# Patient Record
Sex: Female | Born: 1940 | Race: White | Hispanic: No | Marital: Married | Smoking: Never smoker
Health system: Southern US, Community
[De-identification: ages and names within clinical notes are randomized; demographics above are authoritative.]

## PROBLEM LIST (undated history)

## (undated) DIAGNOSIS — L03119 Cellulitis of unspecified part of limb: Secondary | ICD-10-CM

## (undated) DIAGNOSIS — M25552 Pain in left hip: Secondary | ICD-10-CM

## (undated) DIAGNOSIS — E079 Disorder of thyroid, unspecified: Secondary | ICD-10-CM

## (undated) DIAGNOSIS — T7840XA Allergy, unspecified, initial encounter: Secondary | ICD-10-CM

## (undated) DIAGNOSIS — C50919 Malignant neoplasm of unspecified site of unspecified female breast: Secondary | ICD-10-CM

## (undated) HISTORY — PX: BACK SURGERY: SHX140

## (undated) HISTORY — DX: Cellulitis of unspecified part of limb: L03.119

## (undated) HISTORY — DX: Allergy, unspecified, initial encounter: T78.40XA

## (undated) HISTORY — DX: Pain in left hip: M25.552

## (undated) HISTORY — DX: Disorder of thyroid, unspecified: E07.9

## (undated) HISTORY — PX: COLONOSCOPY: SHX174

## (undated) HISTORY — PX: TONSILLECTOMY: SUR1361

## (undated) HISTORY — DX: Malignant neoplasm of unspecified site of unspecified female breast: C50.919

---

## 1999-06-23 ENCOUNTER — Other Ambulatory Visit: Admission: RE | Admit: 1999-06-23 | Discharge: 1999-06-23 | Payer: Self-pay | Admitting: Obstetrics and Gynecology

## 2000-07-13 ENCOUNTER — Other Ambulatory Visit: Admission: RE | Admit: 2000-07-13 | Discharge: 2000-07-13 | Payer: Self-pay | Admitting: Obstetrics and Gynecology

## 2000-11-14 DIAGNOSIS — C50919 Malignant neoplasm of unspecified site of unspecified female breast: Secondary | ICD-10-CM

## 2000-11-14 HISTORY — PX: MASTECTOMY PARTIAL / LUMPECTOMY W/ AXILLARY LYMPHADENECTOMY: SUR852

## 2000-11-14 HISTORY — DX: Malignant neoplasm of unspecified site of unspecified female breast: C50.919

## 2001-08-01 ENCOUNTER — Other Ambulatory Visit: Admission: RE | Admit: 2001-08-01 | Discharge: 2001-08-01 | Payer: Self-pay | Admitting: Obstetrics and Gynecology

## 2001-08-20 ENCOUNTER — Encounter: Payer: Self-pay | Admitting: General Surgery

## 2001-08-20 ENCOUNTER — Encounter: Admission: RE | Admit: 2001-08-20 | Discharge: 2001-08-20 | Payer: Self-pay | Admitting: *Deleted

## 2001-08-30 ENCOUNTER — Encounter: Admission: RE | Admit: 2001-08-30 | Discharge: 2001-08-30 | Payer: Self-pay | Admitting: General Surgery

## 2001-08-30 ENCOUNTER — Encounter (INDEPENDENT_AMBULATORY_CARE_PROVIDER_SITE_OTHER): Payer: Self-pay | Admitting: *Deleted

## 2001-08-30 ENCOUNTER — Ambulatory Visit (HOSPITAL_BASED_OUTPATIENT_CLINIC_OR_DEPARTMENT_OTHER): Admission: RE | Admit: 2001-08-30 | Discharge: 2001-08-30 | Payer: Self-pay | Admitting: General Surgery

## 2001-08-30 ENCOUNTER — Encounter: Payer: Self-pay | Admitting: General Surgery

## 2001-09-10 ENCOUNTER — Encounter (INDEPENDENT_AMBULATORY_CARE_PROVIDER_SITE_OTHER): Payer: Self-pay | Admitting: *Deleted

## 2001-09-10 ENCOUNTER — Encounter: Payer: Self-pay | Admitting: General Surgery

## 2001-09-10 ENCOUNTER — Ambulatory Visit (HOSPITAL_BASED_OUTPATIENT_CLINIC_OR_DEPARTMENT_OTHER): Admission: RE | Admit: 2001-09-10 | Discharge: 2001-09-10 | Payer: Self-pay | Admitting: General Surgery

## 2001-09-18 ENCOUNTER — Encounter (HOSPITAL_COMMUNITY): Admission: RE | Admit: 2001-09-18 | Discharge: 2001-10-18 | Payer: Self-pay | Admitting: Oncology

## 2001-09-18 ENCOUNTER — Ambulatory Visit: Admission: RE | Admit: 2001-09-18 | Discharge: 2001-12-17 | Payer: Self-pay | Admitting: Radiation Oncology

## 2001-09-25 ENCOUNTER — Encounter (HOSPITAL_COMMUNITY): Payer: Self-pay | Admitting: Oncology

## 2002-02-18 ENCOUNTER — Encounter: Admission: RE | Admit: 2002-02-18 | Discharge: 2002-02-18 | Payer: Self-pay | Admitting: Oncology

## 2002-02-18 ENCOUNTER — Encounter (HOSPITAL_COMMUNITY): Admission: RE | Admit: 2002-02-18 | Discharge: 2002-03-20 | Payer: Self-pay | Admitting: Oncology

## 2002-02-26 ENCOUNTER — Ambulatory Visit (HOSPITAL_COMMUNITY): Admission: RE | Admit: 2002-02-26 | Discharge: 2002-02-26 | Payer: Self-pay | Admitting: Internal Medicine

## 2002-03-14 ENCOUNTER — Encounter: Admission: RE | Admit: 2002-03-14 | Discharge: 2002-03-14 | Payer: Self-pay | Admitting: General Surgery

## 2002-03-14 ENCOUNTER — Encounter: Payer: Self-pay | Admitting: General Surgery

## 2002-09-09 ENCOUNTER — Encounter (HOSPITAL_COMMUNITY): Admission: RE | Admit: 2002-09-09 | Discharge: 2002-10-09 | Payer: Self-pay | Admitting: Oncology

## 2002-09-09 ENCOUNTER — Encounter: Admission: RE | Admit: 2002-09-09 | Discharge: 2002-09-09 | Payer: Self-pay | Admitting: Oncology

## 2002-10-02 ENCOUNTER — Other Ambulatory Visit: Admission: RE | Admit: 2002-10-02 | Discharge: 2002-10-02 | Payer: Self-pay | Admitting: Obstetrics and Gynecology

## 2003-03-11 ENCOUNTER — Encounter (HOSPITAL_COMMUNITY): Admission: RE | Admit: 2003-03-11 | Discharge: 2003-04-10 | Payer: Self-pay | Admitting: Oncology

## 2003-03-11 ENCOUNTER — Encounter: Admission: RE | Admit: 2003-03-11 | Discharge: 2003-03-11 | Payer: Self-pay | Admitting: Oncology

## 2003-04-15 ENCOUNTER — Encounter: Payer: Self-pay | Admitting: General Surgery

## 2003-04-15 ENCOUNTER — Encounter: Admission: RE | Admit: 2003-04-15 | Discharge: 2003-04-15 | Payer: Self-pay | Admitting: General Surgery

## 2003-04-21 ENCOUNTER — Encounter (HOSPITAL_COMMUNITY): Payer: Self-pay | Admitting: Oncology

## 2003-04-21 ENCOUNTER — Encounter (HOSPITAL_COMMUNITY): Admission: RE | Admit: 2003-04-21 | Discharge: 2003-07-20 | Payer: Self-pay | Admitting: Oncology

## 2003-04-22 ENCOUNTER — Encounter (HOSPITAL_COMMUNITY): Payer: Self-pay | Admitting: Oncology

## 2003-04-22 ENCOUNTER — Ambulatory Visit (HOSPITAL_COMMUNITY): Admission: RE | Admit: 2003-04-22 | Discharge: 2003-04-22 | Payer: Self-pay | Admitting: Oncology

## 2003-09-10 ENCOUNTER — Encounter (HOSPITAL_COMMUNITY): Admission: RE | Admit: 2003-09-10 | Discharge: 2003-10-10 | Payer: Self-pay | Admitting: Oncology

## 2003-09-10 ENCOUNTER — Encounter: Admission: RE | Admit: 2003-09-10 | Discharge: 2003-09-10 | Payer: Self-pay | Admitting: Oncology

## 2003-10-06 ENCOUNTER — Other Ambulatory Visit: Admission: RE | Admit: 2003-10-06 | Discharge: 2003-10-06 | Payer: Self-pay | Admitting: Obstetrics and Gynecology

## 2003-10-16 ENCOUNTER — Ambulatory Visit: Admission: RE | Admit: 2003-10-16 | Discharge: 2003-10-16 | Payer: Self-pay | Admitting: Radiation Oncology

## 2004-05-03 ENCOUNTER — Encounter: Admission: RE | Admit: 2004-05-03 | Discharge: 2004-05-03 | Payer: Self-pay | Admitting: Oncology

## 2004-06-28 ENCOUNTER — Encounter: Admission: RE | Admit: 2004-06-28 | Discharge: 2004-06-28 | Payer: Self-pay | Admitting: Oncology

## 2004-09-28 ENCOUNTER — Encounter: Admission: RE | Admit: 2004-09-28 | Discharge: 2004-09-28 | Payer: Self-pay | Admitting: Oncology

## 2004-09-28 ENCOUNTER — Ambulatory Visit (HOSPITAL_COMMUNITY): Payer: Self-pay | Admitting: Oncology

## 2004-09-28 ENCOUNTER — Encounter (HOSPITAL_COMMUNITY): Admission: RE | Admit: 2004-09-28 | Discharge: 2004-10-28 | Payer: Self-pay | Admitting: Oncology

## 2004-10-21 ENCOUNTER — Ambulatory Visit: Admission: RE | Admit: 2004-10-21 | Discharge: 2004-10-21 | Payer: Self-pay | Admitting: Radiation Oncology

## 2004-10-28 ENCOUNTER — Ambulatory Visit: Admission: RE | Admit: 2004-10-28 | Discharge: 2004-10-28 | Payer: Self-pay | Admitting: Radiation Oncology

## 2004-11-22 ENCOUNTER — Other Ambulatory Visit: Admission: RE | Admit: 2004-11-22 | Discharge: 2004-11-22 | Payer: Self-pay | Admitting: Obstetrics and Gynecology

## 2005-02-23 ENCOUNTER — Ambulatory Visit (HOSPITAL_COMMUNITY): Admission: RE | Admit: 2005-02-23 | Discharge: 2005-02-23 | Payer: Self-pay | Admitting: Internal Medicine

## 2005-09-01 ENCOUNTER — Ambulatory Visit (HOSPITAL_COMMUNITY): Admission: RE | Admit: 2005-09-01 | Discharge: 2005-09-01 | Payer: Self-pay | Admitting: Internal Medicine

## 2005-09-27 ENCOUNTER — Encounter: Admission: RE | Admit: 2005-09-27 | Discharge: 2005-09-27 | Payer: Self-pay | Admitting: Oncology

## 2005-09-27 ENCOUNTER — Encounter (HOSPITAL_COMMUNITY): Admission: RE | Admit: 2005-09-27 | Discharge: 2005-10-27 | Payer: Self-pay | Admitting: Oncology

## 2005-09-27 ENCOUNTER — Ambulatory Visit (HOSPITAL_COMMUNITY): Payer: Self-pay | Admitting: Oncology

## 2006-03-07 ENCOUNTER — Other Ambulatory Visit: Admission: RE | Admit: 2006-03-07 | Discharge: 2006-03-07 | Payer: Self-pay | Admitting: Obstetrics & Gynecology

## 2006-08-08 ENCOUNTER — Encounter (HOSPITAL_COMMUNITY): Admission: RE | Admit: 2006-08-08 | Discharge: 2006-08-11 | Payer: Self-pay | Admitting: Oncology

## 2006-08-08 ENCOUNTER — Encounter: Admission: RE | Admit: 2006-08-08 | Discharge: 2006-08-11 | Payer: Self-pay | Admitting: Oncology

## 2006-09-27 ENCOUNTER — Ambulatory Visit (HOSPITAL_COMMUNITY): Payer: Self-pay | Admitting: Oncology

## 2006-09-27 ENCOUNTER — Encounter: Admission: RE | Admit: 2006-09-27 | Discharge: 2006-09-27 | Payer: Self-pay | Admitting: Oncology

## 2007-09-26 ENCOUNTER — Ambulatory Visit (HOSPITAL_COMMUNITY): Payer: Self-pay | Admitting: Oncology

## 2008-09-24 ENCOUNTER — Encounter (HOSPITAL_COMMUNITY): Admission: RE | Admit: 2008-09-24 | Discharge: 2008-10-24 | Payer: Self-pay | Admitting: Oncology

## 2008-09-24 ENCOUNTER — Ambulatory Visit (HOSPITAL_COMMUNITY): Payer: Self-pay | Admitting: Oncology

## 2009-01-28 ENCOUNTER — Other Ambulatory Visit: Admission: RE | Admit: 2009-01-28 | Discharge: 2009-01-28 | Payer: Self-pay | Admitting: Obstetrics and Gynecology

## 2009-09-15 ENCOUNTER — Encounter (HOSPITAL_COMMUNITY): Admission: RE | Admit: 2009-09-15 | Discharge: 2009-10-15 | Payer: Self-pay | Admitting: Oncology

## 2009-10-12 ENCOUNTER — Ambulatory Visit (HOSPITAL_COMMUNITY): Payer: Self-pay | Admitting: Oncology

## 2010-10-11 ENCOUNTER — Encounter (HOSPITAL_COMMUNITY)
Admission: RE | Admit: 2010-10-11 | Discharge: 2010-11-10 | Payer: Self-pay | Source: Home / Self Care | Attending: Oncology | Admitting: Oncology

## 2010-10-11 ENCOUNTER — Ambulatory Visit (HOSPITAL_COMMUNITY): Payer: Self-pay | Admitting: Oncology

## 2010-10-12 ENCOUNTER — Encounter: Payer: Self-pay | Admitting: Orthopedic Surgery

## 2010-10-21 ENCOUNTER — Ambulatory Visit: Payer: Self-pay | Admitting: Orthopedic Surgery

## 2010-10-21 DIAGNOSIS — M25559 Pain in unspecified hip: Secondary | ICD-10-CM | POA: Insufficient documentation

## 2010-10-21 DIAGNOSIS — M1610 Unilateral primary osteoarthritis, unspecified hip: Secondary | ICD-10-CM | POA: Insufficient documentation

## 2010-10-21 DIAGNOSIS — M76899 Other specified enthesopathies of unspecified lower limb, excluding foot: Secondary | ICD-10-CM

## 2010-10-28 ENCOUNTER — Encounter: Payer: Self-pay | Admitting: Orthopedic Surgery

## 2010-12-02 ENCOUNTER — Encounter: Payer: Self-pay | Admitting: Orthopedic Surgery

## 2010-12-16 NOTE — Letter (Signed)
Summary: Report [facility]  Report [facility]   Imported By: Cammie Sickle 11/16/2010 18:04:10  _____________________________________________________________________  External Attachment:    Type:   Image     Comment:   External Document

## 2010-12-16 NOTE — Letter (Signed)
Summary: Request for strength training program  Request for strength training program   Imported By: Jacklynn Ganong 12/10/2010 12:51:36  _____________________________________________________________________  External Attachment:    Type:   Image     Comment:   External Document

## 2010-12-16 NOTE — Assessment & Plan Note (Signed)
Summary: LEFT HIP PAIN XR & BONE SCAN AT [facility]   Vital Signs:  Patient profile:   70 year old female Height:      65 inches Weight:      204 pounds Pulse rate:   76 / minute Resp:     18 per minute  Vitals Entered By: Fuller Canada MD (October 21, 2010 9:52 AM)  Visit Type:  new patient Referring Provider:  self Primary Provider:  Dr. [name]  CC:  left hip pain.  History of Present Illness: I saw Traci Mora in the office today for an initial visit.  She is a 70 years old woman with the complaint of:  left hip pain.  This is a 70 year old female, status post lumbar disc excision at L4-L5 for RIGHT sciatica, presents with LEFT hip pain in the groin and also in the front of the thigh, as well as on the iliac crest and along the LEFT greater trochanter,.  She's had a history of breast cancer, and, therefore, had a bone scan, which showed increased uptake over the LEFT greater trochanter. Otherwise, normal. She had an x-ray of her hips, which show some mild degenerative changes.  She has what she describes a sharp pain, which is rated 5/10, which tends to come and go it is worse when she climbs stairs. Over the Thanksgiving holiday. She had increased pain, but she recounts that she had Company over and was much more active. She does take 2 ibuprofen, but only has an as-needed basis and seems to get fairly good relief from that.  No injury.  Xrays and Bone scan of the left hip from 10/12/10   Meds: Levothyroxine, Zyrtec, Calcium, Presorvision, Lutein for eyes, One a day Vitamin.      Allergies (verified): No Known Drug Allergies  Past History:  Past Medical History: thyroid seasonal allergies macular degeneration  Past Surgical History: lumpectomy right breast tonsils back surgery L4-5 disk  Family History: FH of Cancer:   Social History: Patient is married.  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 Respiratory:  Complains of short of breath and couch; denies wheezing, tightness, pain on inspiration, and snoring . Neurologic:  Complains of tingling; denies numbness, unsteady gait, dizziness, tremors, and seizure; in toes. Musculoskeletal:  Complains of joint pain and stiffness; denies swelling, instability, redness, heat, and muscle pain. Endocrine:  Complains of heat or cold intolerance; denies excessive thirst and exessive urination. HEENT:  Complains of watering; denies blurred or double vision, eye pain, and redness; macular degeneration. Immunology:  Complains of seasonal allergies; denies sinus problems and allergic to bee stings.  The review of systems is negative for Cardiovascular, Gastrointestinal, Genitourinary, Psychiatric, Skin, and Hemoatologic.  Physical Exam  Additional Exam:  GEN: well developed, well nourished, normal grooming and hygiene, no deformity and endomorphicl body habitus.   CDV: pulses are normal, no edema, no erythema. no tenderness  Lymph: normal lymph nodes   Skin: no rashes, skin lesions or open sores   NEURO: normal coordination, reflexes, sensation.   Psyche: awake, alert and oriented. Mood normal   Gait: normal  Bilateral lower extremity examination. Inspection there was mild tenderness over both greater trochanters. No tenderness in the thigh or back. She had normal range of motion is not increased range of motion of both hips. She had no pain with flexion  internal rotation, abduction, abduction or internal or external rotation. Motor exam was normal. Both hips were stable and RIGHT straight leg raise was also negative, Lasegue's sign was negative.   Impression & Recommendations:  Problem # 1:  HIP PAIN (ICD-719.45) Assessment New The x-rays were done at [facility]. The report and the films have been reviewed. the bone scan,  and was as stated.  The radiographs of the hips, show minimal arthritic changes with a spherical femoral head, normal acetabulum, but no bone spurs, and normal femoral offset.  Impression mild arthritis, mild bursitis greater trochanter.  Plan continue activities as tolerated exercises as tolerated and follow up as needed  Other Orders: Est. Patient Level IV (04540)  Patient Instructions: 1)  CONTINUE EXERCISES  2)  NO THREAT OF HIP REPLACEMENT  3)  Please schedule a follow-up appointment as needed.   Orders Added: 1)  Est. Patient Level IV [98119]

## 2010-12-16 NOTE — Letter (Signed)
Summary: History form  History form   Imported By: Jacklynn Ganong 10/22/2010 09:31:18  _____________________________________________________________________  External Attachment:    Type:   Image     Comment:   External Document

## 2011-01-05 ENCOUNTER — Other Ambulatory Visit: Payer: Self-pay | Admitting: Dermatology

## 2011-03-29 IMAGING — CR DG HIP (WITH OR WITHOUT PELVIS) 2-3V*L*
3 series · 3 of 3 positions shown · non-contrast
Comparison: None

CLINICAL DATA: Left hip pain.

LEFT HIP - COMPLETE 2+ VIEW

[view not recorded (1 of 3)]
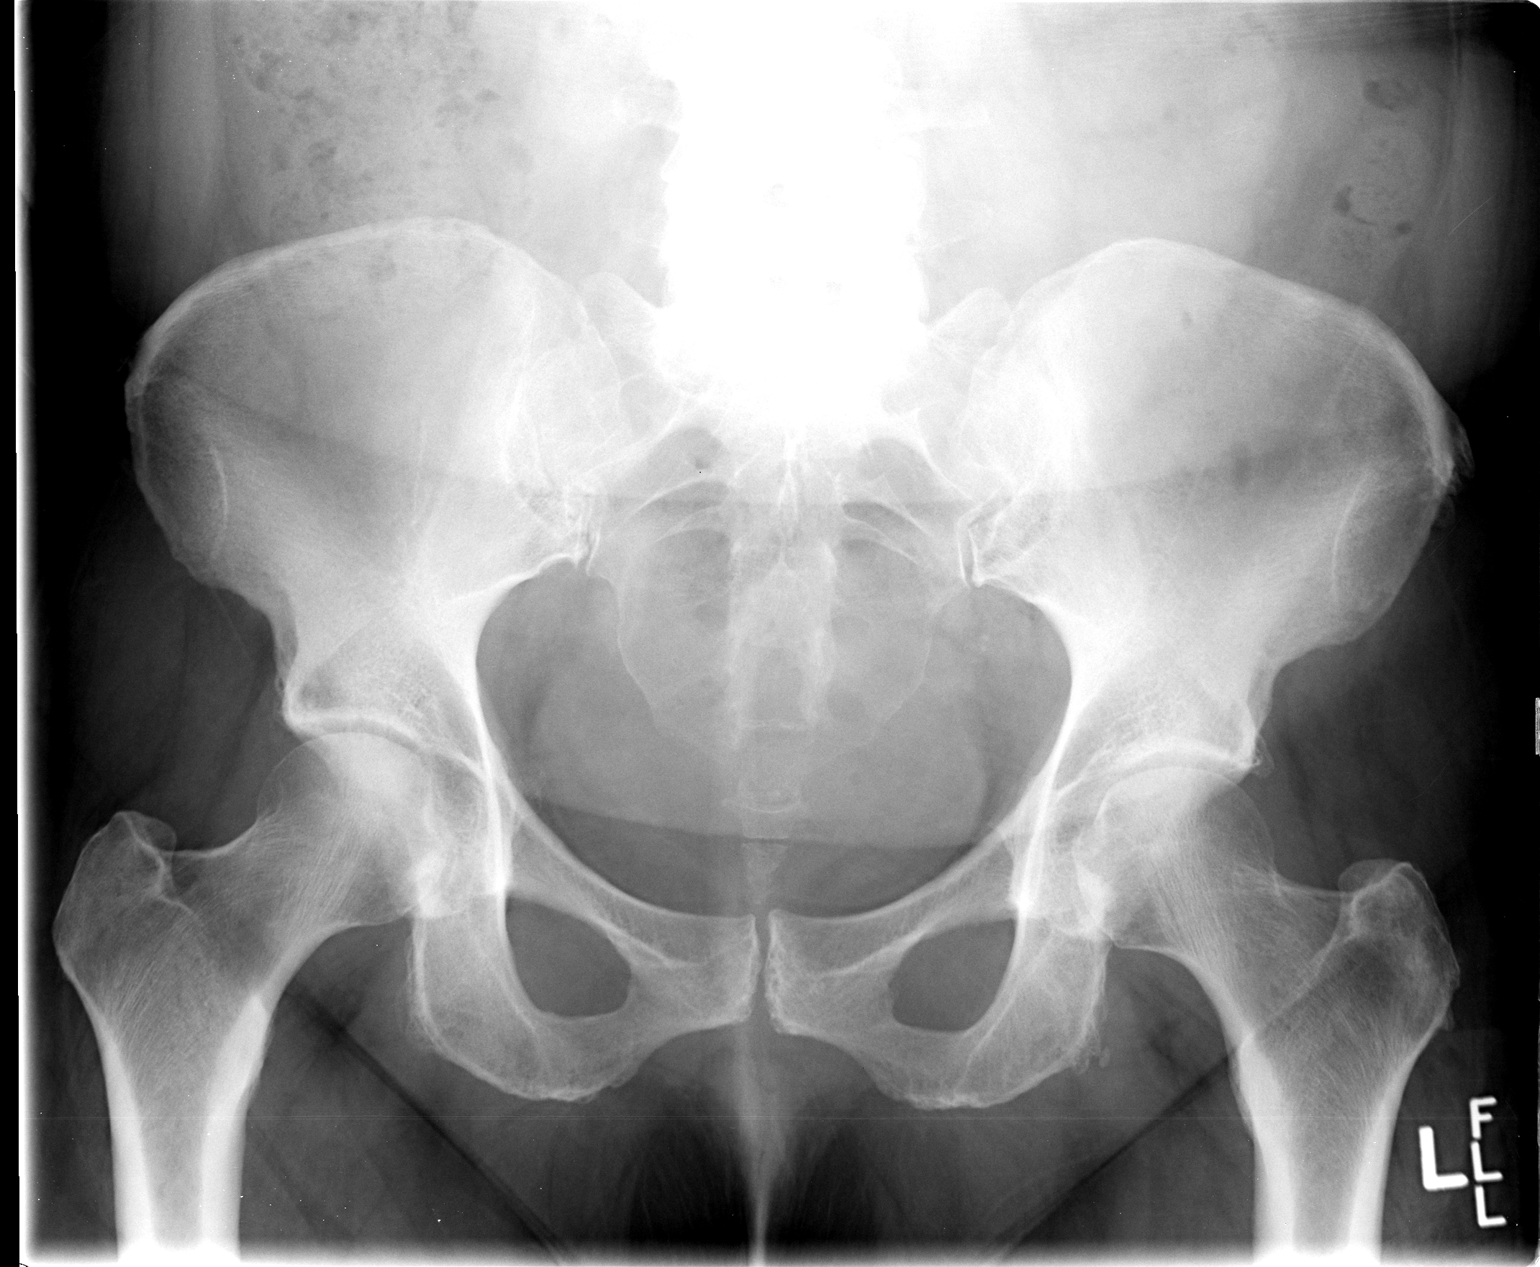

[view not recorded (2 of 3)]
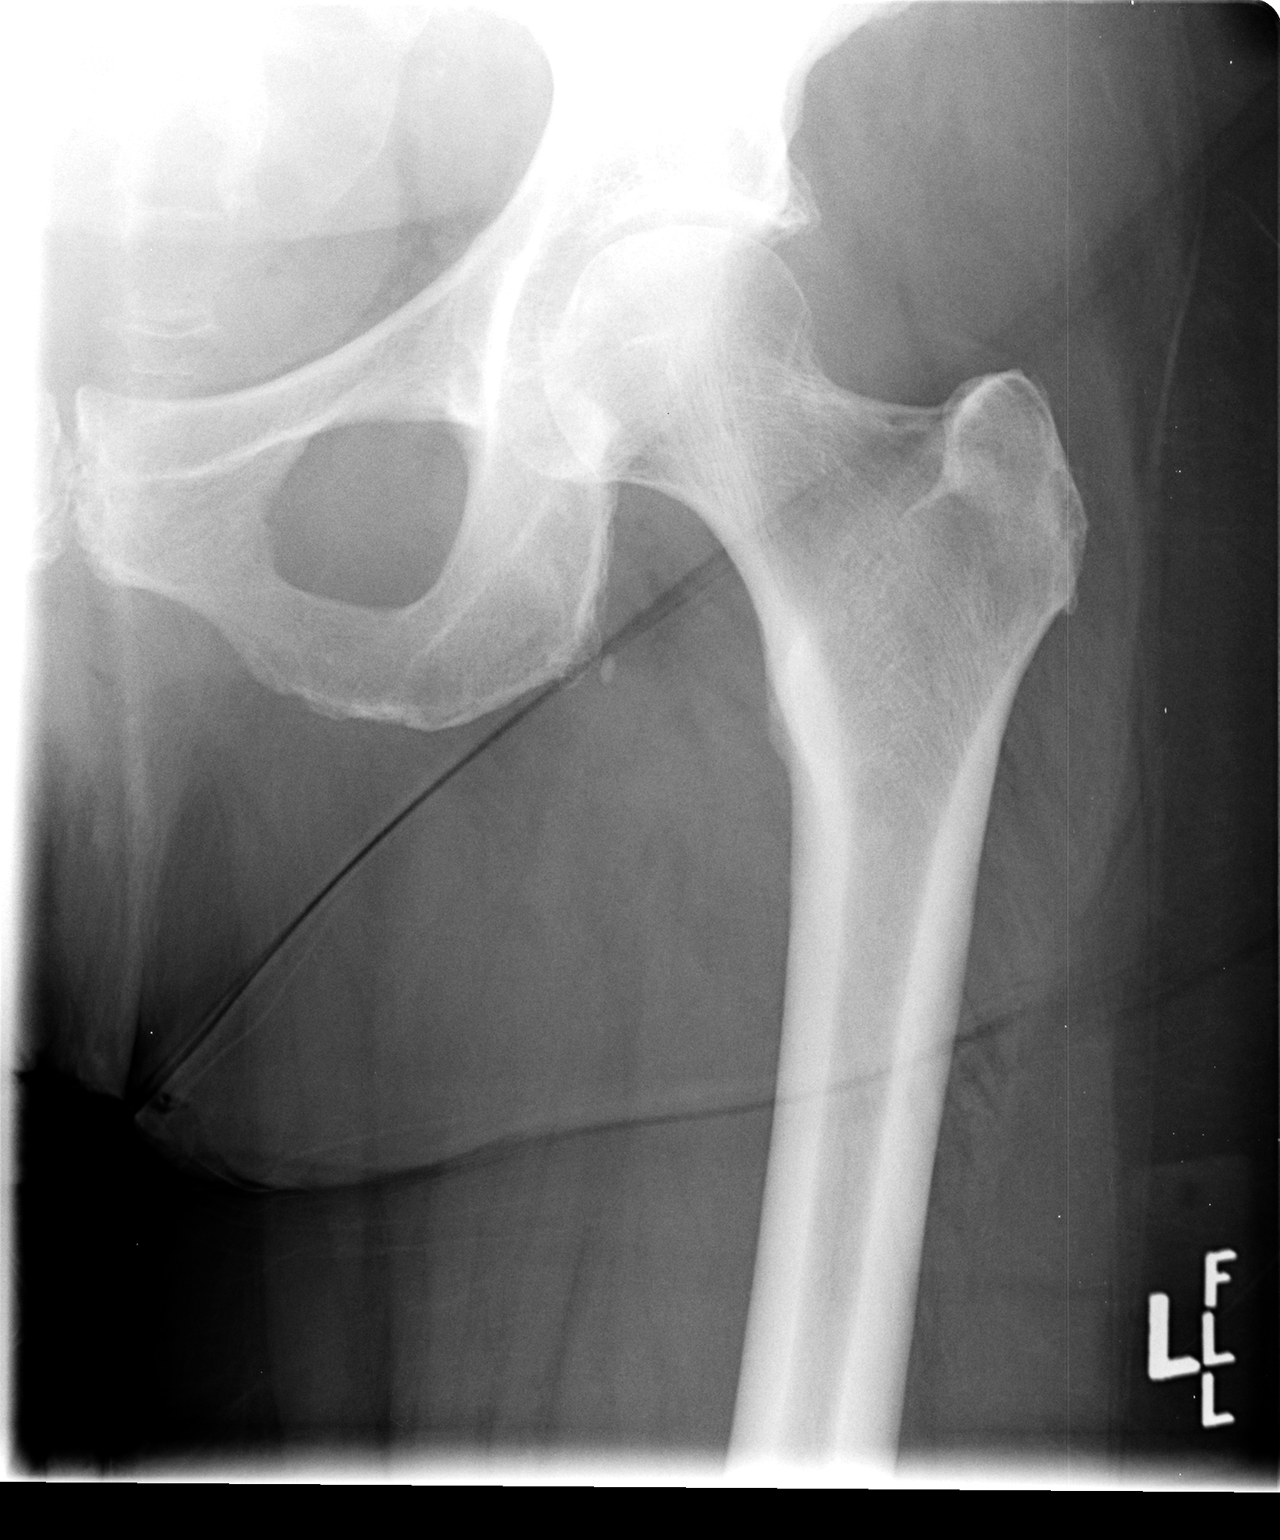

[view not recorded (3 of 3)]
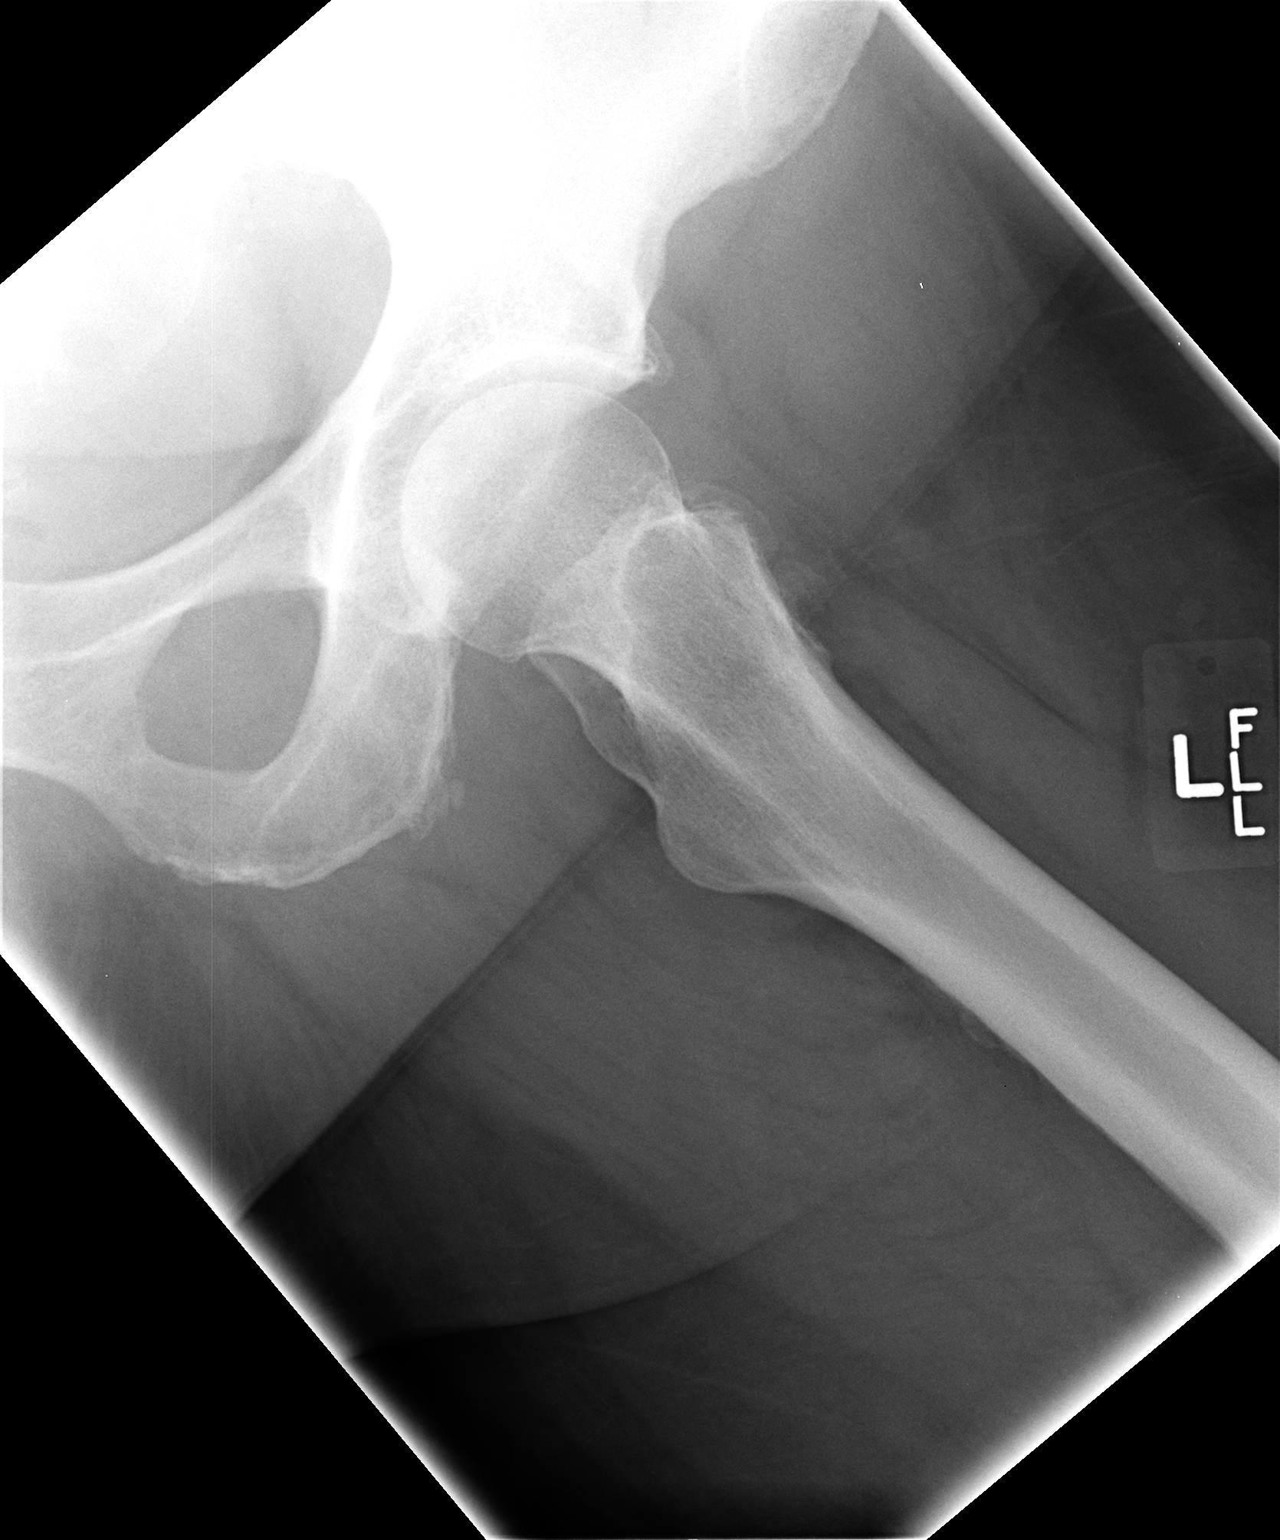

[3 of 3 positions shown; findings below may reference images not displayed]

FINDINGS: Mild degenerative changes in the hips, left slightly
greater than right.  I see no focal abnormality within the
visualized bony structures.  In particular, no abnormal area within
the left hip region as seen on prior bone scan.  No fracture.
IMPRESSION: Mild degenerative changes in the hips.  No acute bony abnormality.

## 2011-04-01 NOTE — Procedures (Signed)
Traci Mora, Traci Mora                ACCOUNT NO.:  [id number]   MEDICAL RECORD NO.:  [id number]          PATIENT TYPE:  OUT   LOCATION:  RAD                           FACILITY:  [facility]   PHYSICIAN:  Edward L. Juanetta Gosling, M.D.DATE OF BIRTH:  May 26, 1941   DATE OF PROCEDURE:  02/23/2005  DATE OF DISCHARGE:                              PULMONARY FUNCTION TEST   Spirometry shows no ventilatory defect but does show slight increase in flow  between 25 and 75% of the forced vital capacity. This could indicate small  airway disease and clinical correlation is suggested. Otherwise normal.      ELH/MEDQ  D:  02/23/2005  T:  02/24/2005  Job:  [id number]

## 2011-04-01 NOTE — Op Note (Signed)
[facility]  Patient:    Traci Mora, Traci Mora Visit Number: [id number] MRN: [id number]          Service Type: END Location: DAY Attending Physician:  Malissa Hippo Dictated by:   Lionel December, M.D. Proc. Date: 02/26/02 Admit Date:  02/26/2002   CC:         Altamese Dilling, M.D.  Ladona Horns. Mariel Sleet, M.D.   Operative Report  PROCEDURE:  Total colonoscopy.  ENDOSCOPIST:  Lionel December, M.D.  INDICATIONS:  Traci Mora is a 70 year old Caucasian who is undergoing screening colonoscopy.  Family history is negative for colorectal carcinoma. Personal history is significant for carcinoma of the breast.  She has no GI symptoms.  Procedure is reviewed with the patient, and informed consent is obtained.  PREOPERATIVE MEDICATIONS:  Demerol 50 mg IV, Versed 4 mg IV.  INSTRUMENTS:  Olympus video system.  DESCRIPTION OF PROCEDURE:  The procedure was performed in the endoscopy suite. The patients vital signs and O2 saturations were monitored during the procedure and remained stable.  The patient was placed in the left lateral decubitus position.  Rectal examination was performed.  No abnormality was noted on external digital exam.  The scope was placed in the rectum and advanced under direct vision into the sigmoid colon and beyond.  The preparation was excellent.  She had some liquid stool in her proximal colon. Scope was passed into the cecum which was identified by ileocecal valve and appendiceal orifice.  There was a small polyp on the fold of the ileocecal valve which was ablated by cold biopsy.  All of a sudden there was blurring. The lens was washed but picture was not clear.  Therefore, the scope was pulled out.  The lens was cleaned and passed again into the proximal colon but once again the hemorrhages were very blurred.  The scope was therefore removed yet again and different scope was used.  It was passed all the way to the cecum.  The cecal  landmarks were well identified.  As the scope was withdrawn, the mucosa was very carefully examined.  There was another 3-4 mm polyp at the sigmoid colon which was ablated by cold biopsy.  Mucosa of the rest of the colon was normal.  Rectal mucosa was also normal.  The scope was retroflexed and examined in the anorectal junction.  Small hemorrhoids were noted below the dentate line.  The endoscope was straightened and withdrawn.  The patient tolerated the procedure well.  FINAL DIAGNOSES: 1. Examination performed to the cecum. 2. Two tiny polyps were ablated with cold biopsy, one from the cecum,    and another one from the sigmoid colon. 3. Small external hemorrhoids.  RECOMMENDATIONS: 1. Standard instructions given. 2. I will be contacting the patient with biopsy results later this week. 3. Timing of her next colonoscopy will depend on histology on these polyps. Dictated by:   Lionel December, M.D. Attending Physician:  Malissa Hippo DD:  02/26/02 TD:  02/26/02 Job: [id number]

## 2011-04-01 NOTE — Op Note (Signed)
[place]  Patient:    Traci Mora, BLACKSON Visit Number: [id number] MRN: [id number]          Service Type: DSU Location: [facility] Attending Physician:  Brandy Hale Dictated by:   Angelia Mould. Derrell Lolling, M.D. Proc. Date: 08/30/01 Admit Date:  08/30/2001   CC:         Cynthia P. Ashley Royalty, M.D.                           Operative Report  PREOPERATIVE DIAGNOSIS:  Right breast mass.  POSTOPERATIVE DIAGNOSIS:  Right breast mass.  OPERATION PERFORMED:  Excisional biopsy of right breast mass, with needle localization and specimen mammogram.  SURGEON:  Angelia Mould. Derrell Lolling, M.D.  OPERATIVE INDICATIONS:  This is a 70 year old white female who had mammograms recently, which showed a focal area of architectural distortion in the upper outer quadrant of the right breast; measuring about 1 cm in size.  This was felt to be highly suspicious for malignancy.  On examination, she does seem to have a little bit of thickening in the upper outer quadrant of the right breast.  A core needle biopsy was performed by Dr. ______ on August 20, 2001 and the specimen was nondiagnostic.  There were atypical glands, somewhat suspicious for carcinoma but not diagnostic.  Excisional biopsy was recommended.  She developed a fairly significant hematoma in the lateral aspect of the right breast.  Nevertheless, she was able to undergo needle localization today and was brought to the operating room electively for wide local excision of this suspicious area.  OPERATIVE TECHNIQUE:  The patient underwent needle localization at the [facility], and was then transported to [facility] [employer].  She was brought to the operating room, where she was monitored and sedated by the anesthesia department.  The right breast was prepped and draped in a sterile fashion.  Xylocaine 1% with epinephrine was used as a local infiltration anesthetic.  A  curved incision was made in the upper outer quadrant of the right breast, overlying the suspected abnormality. The localizing wire entered quite medially in the breast.  There was a fairly significant hematoma at about the 9 oclock position of the breast, but this suspected abnormality was at about the 10 oclock position.  The incision in general paralleled the areolar margin.  Dissection was carried down into the breast tissue.  Medially we took the dissection down, identified the wire, and brought it into the wound.  We dissected around the localizing wire quite widely, and then removed the specimen.  We marked the superior, medial and inferior margins with sutures to orient the pathologist.  This specimen was sent for specimen mammogram, and the report by the radiologist was that the abnormal area had been completely removed.  Hemostasis was excellent and achieved with electrocautery.  The wound was irrigated with saline.  The skin was closed with a running subcuticular suture of 4-0 Vicryl and Steri-Strips. Clean bandages were placed.  The patient was taken to the recovery room in stable condition.  ESTIMATED BLOOD LOSS:  Approximately 20 cc.  COMPLICATIONS:  None.  COUNTS:  Sponge, needle and instrument counts were correct. Dictated by:   Angelia Mould. Derrell Lolling, M.D. Attending Physician:  Brandy Hale DD:  08/30/01 TD:  08/31/01 Job: [id number]

## 2011-04-01 NOTE — Op Note (Signed)
[place]  Patient:    Traci Mora, Traci Mora Visit Number: [id number] MRN: [id number]          Service Type: DSU Location: [facility] Attending Physician:  Brandy Hale Dictated by:   Angelia Mould. Derrell Lolling, M.D. Proc. Date: 09/10/01 Admit Date:  09/10/2001   CC:         Cynthia P. Ashley Royalty, M.D.   Operative Report  PREOPERATIVE DIAGNOSIS:  Carcinoma of the right breast.  POSTOPERATIVE DIAGNOSIS:  Carcinoma of the right breast.  OPERATION PERFORMED:  Right axillary sentinel lymph node mapping and biopsy, injection of blue dye.  SURGEON:  Angelia Mould. Derrell Lolling, M.D.  ANESTHESIA:  INDICATIONS FOR PROCEDURE:  The patient is a 70 year old white female who was recently found to have abnormal mammogram and a vague area of focal thickening in the upper outer quadrant of the right breast.  Core needle biopsy was nondiagnostic.  A right partial mastectomy was performed on August 30, 2001 and the final pathology report showed an invasive lobular carcinoma, 1.5 cm in diameter.  This was well-excised clinically and radiographically.  The pathology report showed that we had a clear margin, but the posterior margin was only 0.8 mm.  There was no evidence of lymphatic or vascular invasion and hormone receptors are pending.  I have discussed her care with Dr. Margaretmary Dys of Radiation Oncology department.  We have decided to offer conservation surgery to her which is her desire.  She is brought to the operating room for sentinel lymph node mapping and biopsy.  DESCRIPTION OF PROCEDURE:  The patient was injected in the nuclear medicine department this morning and brought to the [facility]. The patient was placed on the operating table and general endotracheal anesthesia was induced.  The right axilla and right breast and right chest wall were prepped and draped in sterile fashion.  Neoprobe was used and I could identify the area of  increased activity in the midaxilla which was separate from the injection site around the lumpectomy site.  The breast tissues were injected in four different areas with blue dye and the breast was massaged for three minutes.  I made a transverse incision in the right axilla at the level of the hairline where the area of increased radioactivity was. Dissection was carried down through the subcutaneous tissues.  Dissection was carried out for a short while.  We found sentinel lymph node deep in the level one area.  This lymph node was about 1 cm long by about 3 to 4 mm wide, was intensely blue and radioactivity in this lymph node was about 25 times that of the background activity.  Imprint cytology was performed by Dr. Francoise Schaumann and he said that there was no cancer cells seen.  A couple of other lymph nodes were removed as part of the dissection but they had essentially no blue dye and no radioactivity.  These were sent to the lab for routine histology.  Hemostasis was excellent and achieved with small metal clips and electrocautery.  The wound was irrigated with saline.  The wound was perfectly dry.  The skin was closed with running subcuticular suture of 4-0 Vicryl and Steri-Strips.  Clean bandages were placed and the patient taken to recovery room in stable condition.  Estimated blood loss was about 20 cc.  Complications were none. Sponge, needle and instrument counts were correct. Dictated by:   Angelia Mould. Derrell Lolling, M.D. Attending Physician:  Brandy Hale DD:  09/10/01  TD:  09/10/01 Job: [id number]

## 2011-08-16 LAB — OCCULT BLOOD (STOOL CUP TO LAB): Fecal Occult Bld: NEGATIVE

## 2011-08-16 LAB — OCCULT BLOOD X 1 CARD TO LAB, STOOL
Fecal Occult Bld: NEGATIVE
Fecal Occult Bld: POSITIVE

## 2011-10-10 ENCOUNTER — Encounter (HOSPITAL_COMMUNITY): Payer: Medicare Other | Attending: Oncology | Admitting: Oncology

## 2011-10-10 ENCOUNTER — Encounter (HOSPITAL_COMMUNITY): Payer: Self-pay | Admitting: Oncology

## 2011-10-10 VITALS — BP 140/78 | HR 71 | Temp 98.4°F | Ht 64.0 in | Wt 210.9 lb

## 2011-10-10 DIAGNOSIS — E669 Obesity, unspecified: Secondary | ICD-10-CM

## 2011-10-10 DIAGNOSIS — C50919 Malignant neoplasm of unspecified site of unspecified female breast: Secondary | ICD-10-CM | POA: Insufficient documentation

## 2011-10-10 DIAGNOSIS — E039 Hypothyroidism, unspecified: Secondary | ICD-10-CM | POA: Insufficient documentation

## 2011-10-10 DIAGNOSIS — Z09 Encounter for follow-up examination after completed treatment for conditions other than malignant neoplasm: Secondary | ICD-10-CM | POA: Insufficient documentation

## 2011-10-10 DIAGNOSIS — J309 Allergic rhinitis, unspecified: Secondary | ICD-10-CM | POA: Insufficient documentation

## 2011-10-10 DIAGNOSIS — F329 Major depressive disorder, single episode, unspecified: Secondary | ICD-10-CM

## 2011-10-10 DIAGNOSIS — Z853 Personal history of malignant neoplasm of breast: Secondary | ICD-10-CM | POA: Insufficient documentation

## 2011-10-10 LAB — CBC
Hemoglobin: 12.4 g/dL (ref 12.0–15.0)
MCHC: 33 g/dL (ref 30.0–36.0)
RBC: 4.29 MIL/uL (ref 3.87–5.11)
WBC: 9.2 10*3/uL (ref 4.0–10.5)

## 2011-10-10 LAB — DIFFERENTIAL
Basophils Relative: 0 % (ref 0–1)
Lymphocytes Relative: 15 % (ref 12–46)
Monocytes Relative: 8 % (ref 3–12)
Neutro Abs: 6.7 10*3/uL (ref 1.7–7.7)
Neutrophils Relative %: 73 % (ref 43–77)

## 2011-10-10 LAB — COMPREHENSIVE METABOLIC PANEL
AST: 24 U/L (ref 0–37)
Albumin: 3.6 g/dL (ref 3.5–5.2)
Alkaline Phosphatase: 85 U/L (ref 39–117)
BUN: 17 mg/dL (ref 6–23)
Chloride: 104 mEq/L (ref 96–112)
Potassium: 3.9 mEq/L (ref 3.5–5.1)
Total Bilirubin: 0.3 mg/dL (ref 0.3–1.2)

## 2011-10-10 NOTE — Progress Notes (Signed)
CC:   [name]. [name], MD Angelia Mould. Derrell Lolling, M.D. [facility] Dr. Margaretmary Dys Lionel December, M.D.  DIAGNOSES: 1. 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  Ki-67 marker low at 2%.  Two     sentinel nodes were negative and she was treated with lumpectomy     and sentinel node biopsy followed by radiation therapy and then she     declined any further therapy after a long discussion.  She has no     evidence of recurrent disease with her date of surgery on     09/10/2001. 2. Obesity, now weighing 210 pounds. 3. Hypothyroidism, on replacement. 4. Macular degeneration. 5. Migraine headaches. 6. Allergic rhinitis. 7. Possible asthmatic bronchitis. 8. Gastroesophageal reflux disease. 9. Hormone replacement therapy usage in the past. 10.Disk surgery in 1987. 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 12.Guaiac-positive stool in the past with a negative colonoscopy by     Dr. Karilyn Cota, I believe in 2011. 13.History of ulcer disease in the 1970s. 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 15.Left hip discomfort. Traci Mora states that she was up visiting some friends in [place], was exposed to smoke from a wood stove and some other allergens such as cats and dog dander, and she came home and both she and her husband were wheezing at times and coughing a lot.  She is still coughing but she states it does not keep her awake at night.  I just called Dr. [name] to touch base with him about this wheezing which is present on both lower lungs with each and every expiration, but he states that her workup was never shown asthma or reversible airway disease.  Other than that, she is getting overweight more and more.  She is not exercising.  She has sort of given up a lot of things in some ways  that she used to do on a very regular basis.  She is not always watching her portions, is still eating desserts.  In 12 weeks with [employer] she only lost 5 pounds, which was less than she had expected.  She has a lot of stress on her hands.  Her 2 boys are both having difficulties with their jobs and possible marriages, and this is really getting her down.  The macular degeneration and loss of vision is also getting her down, so she needs to start refocusing on her own health, I think, and get back to doing the things she needs to do such as watching what she eats, exercising, etc.  PHYSICAL EXAMINATION:  Se has on exam this weight of 210 pounds, height 5 feet 4 inches, BMI 36, and a very prominent abdomen just on seeing her sitting in a chair.  Blood pressure 140/78 left arm sitting position, pulse 72 and regular, respirations 18 to 20 and unlabored at rest.  She is afebrile.  Denies any pain.  The lower half of both lungs have expiratory wheezes which are quite significant.  She has no rales, no rubs.  She has no adenopathy in any location.  Both breasts are negative for masses.  The right shows the prior changes from surgery and radiation, which are minimal.  Heart:  Shows a regular rhythm and rate without ,murmur rub or gallop.  Abdomen:  Soft and nontender,  very obese.  Bowel sounds are normal.  She has no peripheral edema.  So we will see her in another year, but I did touch base with Dr. [name], who thinks that this is typically brought on, namely her wheezing, with allergy or infection.  I think her depression may or may not need to be treated at some point, but right now she is going to try to work on that on her own.  We will see her back in a year, sooner if need be.    ______________________________ Ladona Horns. Mariel Sleet, MD ESN/MEDQ  D:  10/10/2011  T:  10/10/2011  Job:  [id number]

## 2011-10-10 NOTE — Patient Instructions (Signed)
Traci Mora  [id number] [birth year]   [facility]  Discharge Instructions  RECOMMENDATIONS MADE BY THE CONSULTANT AND ANY TEST RESULTS WILL BE SENT TO YOUR REFERRING DOCTOR.   EXAM FINDINGS BY MD TODAY AND SIGNS AND SYMPTOMS TO REPORT TO CLINIC OR PRIMARY MD: Exam findings are normal except the cough and wheezing.  Please follow-up with Dr. [name] regarding your continued cough; wheezes were auscultated in your lung fields as well.  I acknowledge that I have been informed and understand all the instructions given to me and received a copy. I do not have any more questions at this time, but understand that I may call the Specialty Clinic at [facility] at [phone] during business hours should I have any further questions or need assistance in obtaining follow-up care.    __________________________________________  _____________  __________ Signature of Patient or Authorized Representative            Date                   Time    __________________________________________ Nurse's Signature

## 2011-10-10 NOTE — Progress Notes (Signed)
This office note has been dictated.

## 2012-06-14 ENCOUNTER — Ambulatory Visit: Payer: Medicare Other | Admitting: Orthopedic Surgery

## 2012-06-18 ENCOUNTER — Ambulatory Visit (HOSPITAL_COMMUNITY): Payer: Medicare Other | Admitting: Physical Therapy

## 2012-06-25 ENCOUNTER — Ambulatory Visit (HOSPITAL_COMMUNITY)
Admission: RE | Admit: 2012-06-25 | Discharge: 2012-06-25 | Disposition: A | Discharge status: Home / Self Care | Payer: Medicare Other | Source: Ambulatory Visit | Attending: Internal Medicine | Admitting: Internal Medicine

## 2012-06-25 DIAGNOSIS — M6281 Muscle weakness (generalized): Secondary | ICD-10-CM | POA: Insufficient documentation

## 2012-06-25 DIAGNOSIS — M542 Cervicalgia: Secondary | ICD-10-CM | POA: Insufficient documentation

## 2012-06-25 DIAGNOSIS — R293 Abnormal posture: Secondary | ICD-10-CM | POA: Insufficient documentation

## 2012-06-25 DIAGNOSIS — IMO0001 Reserved for inherently not codable concepts without codable children: Secondary | ICD-10-CM | POA: Insufficient documentation

## 2012-06-25 NOTE — Evaluation (Signed)
Physical Therapy Evaluation  Patient Details  Name: Traci Mora MRN: [id number] Date of Birth: Aug 16, 1941  Today's Date: 06/25/2012 Time: 0935-1015 PT Time Calculation (min): 40 min  Visit#: 1  of 8   Re-eval: 08/06/12 Assessment Diagnosis: cervical pain Prior Therapy: none   Authorization: medicare  Authorization Time Period:    Authorization Visit#: 1  of 10    Past Medical History:  Past Medical History  Diagnosis Date  . Gastric ulcer     history of  . Thyroid disease   . Breast cancer   . Cellulitis of leg     history of  . Left hip pain     history  . Allergy    Past Surgical History:  Past Surgical History  Procedure Date  . Tonsillectomy   . Back surgery   . Mastectomy partial / lumpectomy w/ axillary lymphadenectomy   . Colonoscopy     Subjective Symptoms/Limitations Symptoms: [name] states that she has been having increased neck pain for several years.  The patient states that the pain has gotten to a point where she is unable to exercise like she has in the past.  She states she started having pain down her right arm in January.  The pt states she goes to the [facility] and the normal exercises she has been doing  has began to cause her pain.  She went to her MD and the MD noted that she has no reflexes in her right elbow.  She has been to massage therapist and they do not seem to get to the deep pain. Pertinent History: Breast cancer 2002; past lumbar surgery 1988 How long can you sit comfortably?: The patient states that she has not noticed any increase in pain with sitting unless she is on the computer. How long can you stand comfortably?: Do difficulty standing How long can you walk comfortably?: no difficulty walking Pain Assessment Currently in Pain?: Yes Pain Score:   3 (worst 9/10; best w/ prednisone 0/10 without 3/10) Pain Location: Neck Pain Orientation: Right Pain Type: Chronic pain Pain Radiating Towards: R hand  Pain Onset: More than a  month ago Pain Frequency: Constant Multiple Pain Sites: No  Prior Functioning  Home Living Lives With: Spouse Prior Function Vocation: On disability Leisure: Hobbies-yes (Comment) Comments: work out, reading, traveling.  Cognition/Observation Cognition Overall Cognitive Status: Appears within functional limits for tasks assessed  Sensation/Coordination/Flexibility/Functional Tests Functional Tests Functional Tests: Neck pain disablitiy index minus driving as pt does not [id number];19/14= 78.2%  Assessment Cervical AROM Cervical Extension: wnl with reps causin aching. Cervical - Right Side Bend: wnl with reps causing tightness Cervical - Left Side Bend:  (decreased 25%l with reps causing aching on the L) Cervical - Right Rotation: wnl  Cervical - Left Rotation: wnl Cervical Strength Cervical Extension: 5/5 Cervical - Right Side Bend: 5/5 Cervical - Left Side Bend: 5/5 Palpation Palpation:  (mild spasm; tight mm throughout trap,(Upper, mid and lower))  Exercise/Treatments Mobility/Balance  Posture/Postural Control Posture/Postural Control: Postural limitations Postural Limitations: Pt holds head SB to R approximately 15 degrees; L shoulder high     Standing Exercises Other Standing Exercises: chest stretch x 5 Seated Exercises Neck Retraction: 5 reps Lateral Flexion: Both;5 reps Other Seated Exercise: hold book on head for proper alignment    Manual Therapy Manual Therapy: Other (comment) Massage: To trap area concentrating on R side. Other Manual Therapy: manual traction.  Physical Therapy Assessment and Plan PT Assessment and Plan Clinical Impression Statement: Pt  with postural instabilites who will benefit from manual and modalities to decrease pain and therapuetic exercise to stabilize c-spine while completing activities.l Pt will benefit from skilled therapeutic intervention in order to improve on the following deficits: Decreased range of motion;Decreased  strength;Impaired tone;Impaired perceived functional ability;Pain Rehab Potential: Good PT Frequency: Min 2X/week PT Duration: 4 weeks PT Treatment/Interventions: Therapeutic activities;Manual techniques;Modalities PT Plan: If pt did well with manual traction may begin mechanical traction;  Pt needs to begin w-back, x to v and t- band ex next visit;  followed by wall push up; prone chin tuck head lift; and prone rows.    Goals Home Exercise Program Pt will Perform Home Exercise Program: Independently PT Short Term Goals Time to Complete Short Term Goals: 4 weeks ((Pt will be on vacation for the next two weeks)) PT Short Term Goal 1: I w/ HEP PT Short Term Goal 2: Pt pain to be decreased 3 levels. PT Long Term Goals Time to Complete Long Term Goals: 8 weeks PT Long Term Goal 1: Pt to be I in advance HEP PT Long Term Goal 2: Pt pain level to be decreased by 6 Long Term Goal 3: Pt to be able to resume normal gym work outs without increased pain. Long Term Goal 4: pt to state she has not experienced radicular sx in a week.  Problem List Patient Active Problem List  Diagnosis  . ARTHRITIS, LEFT HIP  . HIP PAIN  . BURSITIS, LEFT HIP  . Breast cancer  . Postural imbalance    General Behavior During Session: [facility] for tasks performed Cognition: [facility] for tasks performed PT Plan of Care PT Home Exercise Plan: given Consulted and Agree with Plan of Care: Patient  GP Functional Assessment Tool Used: neck pain questionaire minus driving secondary to pt not driving due to poor eyesight.  I chose changing and maintaining body position due to pt main complaint of not being able to be a computer/ work out without pain Functional Limitation: Changing and maintaining body position Changing and Maintaining Body Position Current Status [id number]): At least 20 percent but less than 40 percent impaired, limited or restricted Changing and Maintaining Body Position Goal Status (G2952): At least 1 percent  but less than 20 percent impaired, limited or restricted  Mora,Traci 06/25/2012, 12:29 PM  Physician Documentation Your signature is required to indicate approval of the treatment plan as stated above.  Please sign and either send electronically or make a copy of this report for your files and return this physician signed original.   Please mark one 1.__approve of plan  2. ___approve of plan with the following conditions.   ______________________________                                                          _____________________ Physician Signature  Date  

## 2012-07-10 ENCOUNTER — Ambulatory Visit (HOSPITAL_COMMUNITY)
Admission: RE | Admit: 2012-07-10 | Discharge: 2012-07-10 | Disposition: A | Discharge status: Home / Self Care | Payer: Medicare Other | Source: Ambulatory Visit | Attending: Internal Medicine | Admitting: Internal Medicine

## 2012-07-10 ENCOUNTER — Ambulatory Visit: Payer: Medicare Other | Admitting: Orthopedic Surgery

## 2012-07-10 NOTE — Progress Notes (Signed)
Physical Therapy Treatment Patient Details  Name: Traci Mora MRN: [id number] Date of Birth: 1941/03/29  Today's Date: 07/10/2012 Time: 0940-1030 PT Time Calculation (min): 50 min  Visit#: 2  of 8   Re-eval: 08/06/12 Charges: Therex x 25' Traction x 17'  Authorization: medicare  Authorization Visit#: 2  of 10    Subjective: Symptoms/Limitations Symptoms: Pt reports HEP compliance. Pain Assessment Currently in Pain?: Yes Pain Score:   5 Pain Location: Neck Pain Orientation: Right   Exercise/Treatments Stretches Upper Trapezius Stretch: 3 reps;30 seconds Theraband Exercises Scapula Retraction: 10 reps;Green Shoulder Extension: 10 reps;Green Rows: 10 reps;Green Seated Exercises Neck Retraction: 10 reps;5 secs Lateral Flexion: 10 reps W Back: 5 reps  Modalities Modalities: Traction Traction Type of Traction: Cervical Max (lbs): 12# Hold Time: Static Time: 79' (17')  Physical Therapy Assessment and Plan PT Assessment and Plan Clinical Impression Statement: PT tolerates new therex well. Pt requires vc's for proper form with tband exercises. Began mechanical cervical traction secondary to decreased pain with manual cervical traction. Pt reports pain decrease to 3/10 at end of session. Pt will benefit from skilled therapeutic intervention in order to improve on the following deficits: Decreased range of motion;Decreased strength;Impaired tone;Impaired perceived functional ability;Pain Rehab Potential: Good PT Frequency: Min 2X/week PT Duration: 4 weeks PT Treatment/Interventions: Therapeutic activities;Manual techniques;Modalities PT Plan: Continue to progress per PT POC. Begin wall push-up and prone exercises next session.     Problem List Patient Active Problem List  Diagnosis  . ARTHRITIS, LEFT HIP  . HIP PAIN  . BURSITIS, LEFT HIP  . Breast cancer  . Postural imbalance    PT - End of Session Activity Tolerance: Patient tolerated treatment  well General Behavior During Session: [facility] for tasks performed Cognition: [facility] for tasks performed  GP Functional Assessment Tool Used: neck pain questionaire minus driving secondary to pt not driving due to poor eyesight.  I chose changing and maintaining body position due to pt main complaint of not being able to be a computer/ work out without pain  [name], PTA 07/10/2012, 10:59 AM

## 2012-07-12 ENCOUNTER — Ambulatory Visit (HOSPITAL_COMMUNITY)
Admission: RE | Admit: 2012-07-12 | Discharge: 2012-07-12 | Disposition: A | Discharge status: Home / Self Care | Payer: Medicare Other | Source: Ambulatory Visit | Attending: Internal Medicine | Admitting: Internal Medicine

## 2012-07-12 NOTE — Progress Notes (Signed)
Physical Therapy Treatment Patient Details  Name: ANAILY ASHBAUGH MRN: [id number] Date of Birth: [birth year]  Today's Date: 07/12/2012 Time: 0936-1028 PT Time Calculation (min): 52 min Visit#: 3  of 8   Re-eval: 07/06/12 Charges:  therex 24', traction 15'    Authorization: medicare  Authorization Time Period:    Authorization Visit#: 3  of 10    Subjective: Symptoms/Limitations Symptoms: Pt. states she is a little better since starting traction last visit.  States she's still having pain down her R UE and is 5/10. Pain Assessment Currently in Pain?: Yes Pain Score:   5 Pain Location: Neck Pain Orientation: Right Pain Radiating Towards: R UE into hand   Exercise/Treatments Machines for Strengthening UBE (Upper Arm Bike): 4' @1 .0 backward Theraband Exercises Scapula Retraction: 10 reps;Green Shoulder Extension: 10 reps;Green Rows: 10 reps;Green Standing Exercises Other Standing Exercises: chest stretch x 5 Seated Exercises Neck Retraction: 10 reps Lateral Flexion: Limitations Lateral Flexion Limitations: HEP W Back: 10 reps Shoulder Shrugs: 10 reps   Modalities Modalities: Traction Traction Type of Traction: Cervical Max (lbs): 16# Hold Time: static Time: 34' (17') with neck cushioned; no heat  Physical Therapy Assessment and Plan PT Assessment and Plan Clinical Impression Statement: Difficulty positioning neck in support harness without pain in R occipital bone.  Able to adjust with a washcloth.  Increased traction weight to 16# with good results.  Added wall push ups and able to perform postural tband exercises with good posture and stabilization. PT Plan: Continue to progress toward goals.  Add prone exercises next session.     Problem List Patient Active Problem List  Diagnosis  . ARTHRITIS, LEFT HIP  . HIP PAIN  . BURSITIS, LEFT HIP  . Breast cancer  . Postural imbalance    PT - End of Session Activity Tolerance: Patient tolerated treatment  well General Behavior During Session: [facility] for tasks performed Cognition: [facility] for tasks performed   Lurena Nida, PTA/CLT 07/12/2012, 12:04 PM

## 2012-07-30 ENCOUNTER — Ambulatory Visit (HOSPITAL_COMMUNITY)
Admission: RE | Admit: 2012-07-30 | Discharge: 2012-07-30 | Disposition: A | Discharge status: Home / Self Care | Payer: Medicare Other | Source: Ambulatory Visit | Attending: Internal Medicine | Admitting: Internal Medicine

## 2012-07-30 DIAGNOSIS — IMO0001 Reserved for inherently not codable concepts without codable children: Secondary | ICD-10-CM | POA: Insufficient documentation

## 2012-07-30 DIAGNOSIS — M6281 Muscle weakness (generalized): Secondary | ICD-10-CM | POA: Insufficient documentation

## 2012-07-30 DIAGNOSIS — M542 Cervicalgia: Secondary | ICD-10-CM | POA: Insufficient documentation

## 2012-07-30 NOTE — Evaluation (Signed)
Physical Therapy Re-evaluation  Patient Details  Name: Traci Mora MRN: [id number] Date of Birth: [birth year]  Today's Date: 07/30/2012 Time: 1351-1501 PT Time Calculation (min): 70 min  Visit#: 4  of 16   Re-eval: 07/06/12 Charges: ROMM x1 Mechanical traction x 17' MHP x 10' Self care x 15'  Authorization: medicare  Authorization Visit#: 4  of 14    Past Medical History:  Past Medical History  Diagnosis Date  . Gastric ulcer     history of  . Thyroid disease   . Breast cancer   . Cellulitis of leg     history of  . Left hip pain     history  . Allergy    Past Surgical History:  Past Surgical History  Procedure Date  . Tonsillectomy   . Back surgery   . Mastectomy partial / lumpectomy w/ axillary lymphadenectomy   . Colonoscopy     Subjective Symptoms/Limitations Symptoms: Pt states that her neck pain had been low until this morning when she was doing laundry. Pain Assessment Currently in Pain?: Yes Pain Score:   7 Pain Location: Neck Pain Orientation: Right Pain Radiating Towards: R UE into hand   Sensation/Coordination/Flexibility/Functional Tests Functional Tests Functional Tests: Neck pain disablitiy index minus driving as pt does not NWGNF;62/13=08.6%  Assessment Cervical AROM Cervical Extension: wnl w/o pain (did have pain with this motion at eval) Cervical - Right Side Bend: wnl without pain (did have pain with this motion at eval) Cervical - Left Side Bend: decreased 20% with soreness in R cervical mm (was decreased 25%) Cervical - Right Rotation: wnl  Cervical - Left Rotation: wnl Cervical Strength Cervical Extension: 5/5 Cervical - Right Side Bend: 5/5 Cervical - Left Side Bend: 5/5  Exercise/Treatments Mobility/Balance  Posture/Postural Control Postural Limitations: Pt holds head SB to R approximately 5 degrees; L shoulder slightly high   Modalities Modalities: Traction;Moist Heat Moist Heat Therapy Number Minutes Moist Heat: 10  Minutes Moist Heat Location: Other (comment) (Cervical (after traction)) Traction Type of Traction: Cervical Max (lbs): 12# Hold Time: static Time: 45' (17') with neck cushioned  Physical Therapy Assessment and Plan PT Assessment and Plan Clinical Impression Statement: Pt has been on vacation and unable to come to therapy consistently. Pt states that her pain has decreased since beginning therapy. Pt also reports HEP compliance. Traction appears to be reliving neck pain. Pt would benefit from continued therapy to improve posture and decrease pain. Decreased wt with traction back to original wt (12#) as pt has increased pain after last session. Pt reports pain in occipital area after traction secondary to the occipitals pads on traction machine. MHP was applied to cervical area to decrease pain. Pt reports pain decrease to 3/10 at end of session. PT Plan: Recommend to continue 2 x a week x 4 wks.     Problem List Patient Active Problem List  Diagnosis  . ARTHRITIS, LEFT HIP  . HIP PAIN  . BURSITIS, LEFT HIP  . Breast cancer  . Postural imbalance    PT - End of Session Activity Tolerance: Patient tolerated treatment well General Behavior During Session: [facility] for tasks performed Cognition: [facility] for tasks performed  GP Functional Limitation: Changing and maintaining body position Changing and Maintaining Body Position Current Status (V7846): At least 20 percent but less than 40 percent impaired, limited or restricted Changing and Maintaining Body Position Goal Status (N6295): At least 1 percent but less than 20 percent impaired, limited or restricted  [name], PTA  07/30/2012, 3:14 PM  Physician Documentation Your signature is required to indicate approval of the treatment plan as stated above.  Please sign and either send electronically or make a copy of this report for your files and return this physician signed original.   Please mark one 1.__approve of plan  2. ___approve  of plan with the following conditions.   ______________________________                                                          _____________________ Physician Signature                                                                                                             Date

## 2012-08-01 ENCOUNTER — Ambulatory Visit (HOSPITAL_COMMUNITY): Payer: Medicare Other | Admitting: *Deleted

## 2012-08-06 ENCOUNTER — Ambulatory Visit (HOSPITAL_COMMUNITY): Payer: Medicare Other | Admitting: Physical Therapy

## 2012-08-07 ENCOUNTER — Ambulatory Visit (HOSPITAL_COMMUNITY)
Admission: RE | Admit: 2012-08-07 | Discharge: 2012-08-07 | Disposition: A | Discharge status: Home / Self Care | Payer: Medicare Other | Source: Ambulatory Visit | Attending: Internal Medicine | Admitting: Internal Medicine

## 2012-08-07 DIAGNOSIS — R293 Abnormal posture: Secondary | ICD-10-CM

## 2012-08-07 NOTE — Progress Notes (Signed)
Physical Therapy Treatment Patient Details  Name: Traci Mora MRN: [id number] Date of Birth: 1941/07/25  Today's Date: 08/07/2012 Time: 1350-1440 PT Time Calculation (min): 50 min  Visit#: 5  of 16   Re-eval: 08/28/12    Authorization: medicare    Authorization Visit#: 5  of 10    Subjective: Symptoms/Limitations Symptoms: Pt states when she is not moving she does not have much pain but increasing use of her R arm increases the pain; also has noted increased pain in the AM   Exercise/Treatments   Machines for Strengthening UBE (Upper Arm Bike): 4' @1 .0 backward Theraband Exercises Scapula Retraction: 10 reps;Green Shoulder Extension: 10 reps;Green Rows: 10 reps;Green Standing Exercises Other Standing Exercises: chest stretch x 5 Seated Exercises Neck Retraction Limitations: HEP Lateral Flexion: Limitations Lateral Flexion Limitations: HEP     Modalities Modalities: Moist Heat;Traction Manual Therapy Manual Therapy: Massage Massage: To B mid trap area with increased attention to R Moist Heat Therapy Number Minutes Moist Heat: 15 Minutes Moist Heat Location:  (to upper back with cervical traction.) Traction Type of Traction: Cervical Min (lbs): 10 Max (lbs): 15 Hold Time: 60 Rest Time: 20 Time: 12  Physical Therapy Assessment and Plan PT Assessment and Plan Clinical Impression Statement: Pt has improved posture.  Still c/o of R radicular pain with increased activity.  Changed traction to intermittent with 15 max 10 min with better tolerance.  PT Plan: teach pt T-band decompressive exercises (sash, overhead, er....)    Goals    Problem List Patient Active Problem List  Diagnosis  . ARTHRITIS, LEFT HIP  . HIP PAIN  . BURSITIS, LEFT HIP  . Breast cancer  . Postural imbalance    PT - End of Session Activity Tolerance: Patient tolerated treatment well General Behavior During Session: [facility] for tasks performed  GP    Natanael Saladin,CINDY 08/07/2012,  2:38 PM

## 2012-08-08 ENCOUNTER — Ambulatory Visit (HOSPITAL_COMMUNITY): Payer: Medicare Other | Admitting: *Deleted

## 2012-08-09 ENCOUNTER — Ambulatory Visit (HOSPITAL_COMMUNITY)
Admission: RE | Admit: 2012-08-09 | Discharge: 2012-08-09 | Disposition: A | Discharge status: Home / Self Care | Payer: Medicare Other | Source: Ambulatory Visit | Attending: Internal Medicine | Admitting: Internal Medicine

## 2012-08-09 NOTE — Progress Notes (Signed)
Physical Therapy Treatment Patient Details  Name: Traci Mora MRN: [id number] Date of Birth: 26-Apr-1941  Today's Date: 08/09/2012 Time: 1435-1530 PT Time Calculation (min): 55 min  Visit#: 6  of 16   Re-eval: 08/28/12 Charges: Therex x 35' Manual x 10'   Authorization: medicare  Authorization Visit#: 6  of 10    Subjective: Symptoms/Limitations Symptoms: Pt states that she would like to hold traction today. Pain Assessment Currently in Pain?: Yes Pain Score:   5 Pain Location: Neck Pain Orientation: Right Pain Radiating Towards: RUE   Exercise/Treatments Machines for Strengthening UBE (Upper Arm Bike): 4' @2 .0 backward Theraband Exercises Scapula Retraction: 15 reps;Green Shoulder Extension: 15 reps;Green Rows: 15 reps;Green Supine Exercises Other Supine Exercise: Decompressive tband exercises x 5 each  Modalities Modalities: Moist Heat Manual Therapy Other Manual Therapy: Manual traction and STM to cervical x 10' Moist Heat Therapy Number Minutes Moist Heat: 15 Minutes Moist Heat Location:  (Cervical before and during manual)  Physical Therapy Assessment and Plan PT Assessment and Plan Clinical Impression Statement: Pt completes therex with good form and minimal need for cueing. Manual traction completed rather than mechanical this session secondary to no beneficial results after previous treatments. Pt reports no change in pain but decreased tightness at end of session. PT Plan: Continue to progress strength/ROM and decrease pain per PT POC.     Problem List Patient Active Problem List  Diagnosis  . ARTHRITIS, LEFT HIP  . HIP PAIN  . BURSITIS, LEFT HIP  . Breast cancer  . Postural imbalance    PT - End of Session Activity Tolerance: Patient tolerated treatment well General Behavior During Session: [name] for tasks performed Cognition: [name] for tasks performed PT Plan of Care PT Home Exercise Plan: HEP given for decompressive tband  exercises.   [name], PTA 08/09/2012, 4:01 PM

## 2012-08-14 ENCOUNTER — Ambulatory Visit (HOSPITAL_COMMUNITY)
Admission: RE | Admit: 2012-08-14 | Discharge: 2012-08-14 | Disposition: A | Discharge status: Home / Self Care | Payer: Medicare Other | Source: Ambulatory Visit | Attending: Internal Medicine | Admitting: Internal Medicine

## 2012-08-14 DIAGNOSIS — M6281 Muscle weakness (generalized): Secondary | ICD-10-CM | POA: Insufficient documentation

## 2012-08-14 DIAGNOSIS — M542 Cervicalgia: Secondary | ICD-10-CM | POA: Insufficient documentation

## 2012-08-14 DIAGNOSIS — IMO0001 Reserved for inherently not codable concepts without codable children: Secondary | ICD-10-CM | POA: Insufficient documentation

## 2012-08-14 NOTE — Progress Notes (Signed)
Physical Therapy Treatment Patient Details  Name: Traci Mora MRN: [id number] Date of Birth: 1940-12-06  Today's Date: 08/14/2012 Time: 7846-9629 PT Time Calculation (min): 60 min  Visit#: 7  of 16   Re-eval: 08/28/12 Charges: Therex x 20' Manual x 20' MHP x 10'  Authorization: Medicare  Authorization Visit#: 7  of 10    Subjective: Symptoms/Limitations Symptoms: My pain has decreased.  Pain Assessment Currently in Pain?: Yes Pain Score:   2 (1-2/10) Pain Location: Neck Pain Orientation: Right   Exercise/Treatments Theraband Exercises Scapula Retraction: 15 reps;Green Shoulder Extension: 15 reps;Green Rows: 15 reps;Green Seated Exercises X to V: 10 reps W Back: 10 reps Money: 10 reps Other Seated Exercise: Corner elbow press 10x5"  Modalities Modalities: Moist Heat Manual Therapy Other Manual Therapy: Manual traction and STM to B cervical and UT area x 20'  Moist Heat Therapy Number Minutes Moist Heat: 10 Minutes Moist Heat Location:  (Cervical before manual)  Physical Therapy Assessment and Plan PT Assessment and Plan Clinical Impression Statement: Pt has become more aware of correct posture when completing therex. Manual techniques continued this session. Pt reports pain decrease to 0-1/10 at end of session. PT Plan: Continue to progress strength/ROM and decrease pain per PT POC.    Problem List Patient Active Problem List  Diagnosis  . ARTHRITIS, LEFT HIP  . HIP PAIN  . BURSITIS, LEFT HIP  . Breast cancer  . Postural imbalance    PT - End of Session Activity Tolerance: Patient tolerated treatment well General Behavior During Session: [facility] for tasks performed Cognition: [facility] for tasks performed  [name], PTA 08/14/2012, 3:38 PM

## 2012-08-16 ENCOUNTER — Ambulatory Visit (HOSPITAL_COMMUNITY)
Admission: RE | Admit: 2012-08-16 | Discharge: 2012-08-16 | Disposition: A | Discharge status: Home / Self Care | Payer: Medicare Other | Source: Ambulatory Visit | Attending: Internal Medicine | Admitting: Internal Medicine

## 2012-08-16 DIAGNOSIS — R293 Abnormal posture: Secondary | ICD-10-CM

## 2012-08-16 NOTE — Progress Notes (Signed)
Physical Therapy Treatment Patient Details  Name: Traci Mora MRN: [id number] Date of Birth: 1941-11-01  Today's Date: 08/16/2012 Time:1430  - 1524    Visit#: 8  of 16   Re-eval: 08/28/12    Authorization: Medicare  Authorization Time Period:    Authorization Visit#: 8  of 10    Subjective: Symptoms/Limitations Symptoms: Traci Mora states earlier in the day she would have stated that her pain was at a 5 but it is back down to a 2 now. Pt doing decompressive ex to pain explained that decompressive ex should not be painful    Exercise/Treatments    Standing Exercises Neck Retraction: Limitations Neck Retraction Limitations: againstwall x 10 Wall Push Ups: 10 reps Upper Extremity Flexion with Stabilization: Flexion;10 reps;Limitations UE Flexion with Stabilization Limitations: with chin tucked Other Standing Exercises:  (chest stretchx 10) Seated Exercises X to V: 10 reps;Weight X to V Weights (lbs): 2 Other Seated Exercise: B shld ext w/ 2# x 10  Manual Therapy Manual Therapy: Massage Massage: Manual for suboccipital release; traction and myofasical release w/ min.  mm tightness noticed.  Physical Therapy Assessment and Plan PT Assessment and Plan Clinical Impression Statement: Pt will be out of town x 2 weeks reassess next visit.  Need at least two more visits one for prone second to review.l    Goals    Problem List Patient Active Problem List  Diagnosis  . ARTHRITIS, LEFT HIP  . HIP PAIN  . BURSITIS, LEFT HIP  . Breast cancer  . Postural imbalance       GP    Traci Mora 08/16/2012, 3:30 PM

## 2012-09-04 ENCOUNTER — Ambulatory Visit (HOSPITAL_COMMUNITY): Payer: Medicare Other | Admitting: Physical Therapy

## 2012-09-04 ENCOUNTER — Ambulatory Visit (HOSPITAL_COMMUNITY)
Admission: RE | Admit: 2012-09-04 | Discharge: 2012-09-04 | Disposition: A | Discharge status: Home / Self Care | Payer: Medicare Other | Source: Ambulatory Visit | Attending: Physical Therapy | Admitting: Physical Therapy

## 2012-09-04 DIAGNOSIS — R293 Abnormal posture: Secondary | ICD-10-CM

## 2012-09-04 NOTE — Progress Notes (Addendum)
Physical Therapy Treatment Patient Details  Name: Traci Mora MRN: [id number] Date of Birth: [birth year]  Today's Date: 09/04/2012 Time: 4098-1191 PT Time Calculation (min): 35 min  Visit#: 9  of 16   Re-eval: 09/13/12 Assessment Diagnosis: cervical pain Charge: Manual 23', ROM Measurement x 1 unit, therex 8'  Authorization: Medicare  Authorization Time Period:    Authorization Visit#: 9  of 10    Subjective: Symptoms/Limitations Symptoms: No neck pain when still, pain increase R bicep region to 3/10 especially with IR and ER. Pain Assessment Currently in Pain?: Yes Pain Score:   3 Pain Location: Shoulder Pain Orientation: Right  Objective:   Exercise/Treatments Standing Exercises Neck Retraction: Limitations Neck Retraction Limitations: againstwall x 10 Other Standing Exercises:  (chest stretchx 10) Seated Exercises X to V: 15 reps  Manual Therapy Manual Therapy: Massage Massage: Manual for suboccipital release; traction and myofasical release w/ min. mm tightness noticed  Physical Therapy Assessment and Plan PT Assessment and Plan Clinical Impression Statement: Reassessment complete.  Mrs. Traci Mora has had 9 OPPT sessions over 10 weeks minus some vacation weeks and has met 2/2 STG and 2/4 LTGs.  Pt is independent with advanced HEP and is able to demonstrate appropriate form and technique with the new HEP.  Pt stated max pain level a 3/10 with R arm movements.  Pt stated no neck pain remaining but continues to have pain R bicep region.  Pain in R arm has limited pt from resuming normal gym workouts.  Pt overall cervical strength is 5/5 all movements and ROM WNL except for L SB continues to be decreased by 20% PT Plan: Pt to return for one more treatment to be reassessed by evaluting therapist.  G-code is due this session.  Pt to complete the neck pain disability index next session.    Goals Home Exercise Program Pt will Perform Home Exercise Program: Independently PT  Goal: Perform Home Exercise Program - Progress: Progressing toward goal PT Short Term Goals Time to Complete Short Term Goals: 4 weeks ((Pt will be on vacation for the next two weeks)) PT Short Term Goal 1: I w/ HEP PT Short Term Goal 1 - Progress: Progressing toward goal (reports was more compliant before vacation) PT Short Term Goal 2: Pt pain to be decreased 3 levels. PT Short Term Goal 2 - Progress: Met (average pain scale less than 3/10 ) PT Long Term Goals Time to Complete Long Term Goals: 8 weeks PT Long Term Goal 1: Pt to be I in advance HEP PT Long Term Goal 1 - Progress: Met PT Long Term Goal 2: Pt pain level to be decreased by 6 PT Long Term Goal 2 - Progress: Met (average pain scale less than 3/10) Long Term Goal 3: Pt to be able to resume normal gym work outs without increased pain. Long Term Goal 3 Progress: Not met Long Term Goal 4: pt to state she has not experienced radicular sx in a week Long Term Goal 4 Progress: Not met  Problem List Patient Active Problem List  Diagnosis  . ARTHRITIS, LEFT HIP  . HIP PAIN  . BURSITIS, LEFT HIP  . Breast cancer  . Postural imbalance    PT - End of Session Activity Tolerance: Patient tolerated treatment well;Patient limited by pain General Behavior During Session: [facility] for tasks performed Cognition: [facility] for tasks performed  GP    Traci Mora 09/04/2012, 6:42 PM

## 2012-09-06 ENCOUNTER — Ambulatory Visit (HOSPITAL_COMMUNITY): Payer: Medicare Other | Admitting: *Deleted

## 2012-09-11 ENCOUNTER — Ambulatory Visit (HOSPITAL_COMMUNITY): Payer: Medicare Other | Admitting: Physical Therapy

## 2012-09-13 ENCOUNTER — Inpatient Hospital Stay (HOSPITAL_COMMUNITY): Admission: RE | Admit: 2012-09-13 | Payer: Medicare Other | Source: Ambulatory Visit | Admitting: Physical Therapy

## 2012-09-13 ENCOUNTER — Ambulatory Visit (HOSPITAL_COMMUNITY): Payer: Medicare Other | Admitting: Physical Therapy

## 2012-10-09 ENCOUNTER — Encounter (HOSPITAL_COMMUNITY): Payer: Medicare Other | Attending: Oncology | Admitting: Oncology

## 2012-10-09 ENCOUNTER — Other Ambulatory Visit (HOSPITAL_COMMUNITY): Payer: Self-pay | Admitting: Internal Medicine

## 2012-10-09 VITALS — BP 136/86 | HR 74 | Temp 97.8°F | Resp 18 | Wt 198.0 lb

## 2012-10-09 DIAGNOSIS — E039 Hypothyroidism, unspecified: Secondary | ICD-10-CM

## 2012-10-09 DIAGNOSIS — M5410 Radiculopathy, site unspecified: Secondary | ICD-10-CM

## 2012-10-09 DIAGNOSIS — Z853 Personal history of malignant neoplasm of breast: Secondary | ICD-10-CM

## 2012-10-09 DIAGNOSIS — C50919 Malignant neoplasm of unspecified site of unspecified female breast: Secondary | ICD-10-CM

## 2012-10-09 NOTE — Progress Notes (Signed)
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 Ki-67 marker was low of 2%. 2 sentinel nodes were negative and she was treated with lumpectomy, sentinel node biopsy, followed by radiation therapy and then she declined any further therapy after long discussions. She thus far remains free of disease with her surgery 09/02/2001. Her laboratory work by Dr. [name] in October 2013 was excellent. This included a CBC, cmet, lipid panel, TSH, and urinalysis. Problem #2 right upper arm discomfort with loss of the triceps reflex. She also has neck muscle spasticity at both bases. Strength thus far appears to be preserved in the hands and arms. Problem #3 obesity but she has lost 12 pounds in one year do a change in lifestyle, predominantly food choices. Problem #4 macular degeneration getting worse Problem #5 GERD Problem #6 disc surgery of the low back in 1987 Problem #7 ulcer disease in the 1990s Problem #8 allergic rhinitis Problem #9 hypothyroidism on replacement Problem #10 guaiac positive stool with a negative colonoscopy in 2011 by Dr. Karilyn Cota Her eyes are somewhat worse and she does not think she can drive easily anymore. She is not aware of lumps or bumps anywhere. She has been seen physical therapy for this right arm discomfort with some improvement in her sense of neck discomfort but the real discomfort is in the right upper arm.  She is changed her diet significantly in conjunction with her husband who does much of the cooking now.  I don't think she looks nearly is depressed as she did last year. Oncology review of systems otherwise is noncontributory. She has not had an MRI of her C-spine which she may need. BP 136/86  Pulse 74  Temp 97.8 F (36.6 C) (Oral)  Resp 18  Wt 198 lb (89.812 kg)  She is in no acute distress. I think her hand strength and finger strength appear  symmetrical. Her biceps appear symmetrically strong. Her right triceps reflexes gone the left is still very intact. She does have muscle spasticity of both sides of her neck especially at the bases. She has clear lung fields. Skin exam is benign overall. Heart shows a regular rhythm and rate without murmur rub or gallop. The left breast is negative for masses as is the right but one her right arm is behind her head she has a lot more discomfort in her right upper arm. Surgical changes are noted. Abdomen shows decreased bowel sounds but no organomegaly. She has no obvious ascites. She has no arm or leg edema.  She will be back in touch with Dr. [name] about her neck and right arm discomfort. I will continue see her in one year.

## 2012-10-09 NOTE — Patient Instructions (Addendum)
[facility]  Discharge Instructions  RECOMMENDATIONS MADE BY THE CONSULTANT AND ANY TEST RESULTS WILL BE SENT TO YOUR REFERRING DOCTOR.   EXAM FINDINGS BY MD TODAY AND SIGNS AND SYMPTOMS TO REPORT TO CLINIC OR PRIMARY MD: Exam good. See Dr. [name] again to inquire about  X-rays    SPECIAL INSTRUCTIONS/FOLLOW-UP: Return in one year.   I acknowledge that I have been informed and understand all the instructions given to me and received a copy. I do not have any more questions at this time, but understand that I may call the Specialty Clinic at [facility] at [phone] during business hours should I have any further questions or need assistance in obtaining follow-up care.    __________________________________________  _____________  __________ Signature of Patient or Authorized Representative            Date                   Time    __________________________________________ Nurse's Signature

## 2012-10-15 ENCOUNTER — Ambulatory Visit (HOSPITAL_COMMUNITY)
Admission: RE | Admit: 2012-10-15 | Discharge: 2012-10-15 | Disposition: A | Discharge status: Home / Self Care | Payer: Medicare Other | Source: Ambulatory Visit | Attending: Internal Medicine | Admitting: Internal Medicine

## 2012-10-15 DIAGNOSIS — M47812 Spondylosis without myelopathy or radiculopathy, cervical region: Secondary | ICD-10-CM | POA: Insufficient documentation

## 2012-10-15 DIAGNOSIS — M5030 Other cervical disc degeneration, unspecified cervical region: Secondary | ICD-10-CM | POA: Insufficient documentation

## 2012-10-15 DIAGNOSIS — M5410 Radiculopathy, site unspecified: Secondary | ICD-10-CM

## 2013-04-03 ENCOUNTER — Ambulatory Visit (INDEPENDENT_AMBULATORY_CARE_PROVIDER_SITE_OTHER): Payer: Medicare Other | Admitting: Orthopedic Surgery

## 2013-04-03 ENCOUNTER — Ambulatory Visit (INDEPENDENT_AMBULATORY_CARE_PROVIDER_SITE_OTHER): Payer: Medicare Other

## 2013-04-03 ENCOUNTER — Telehealth: Payer: Self-pay | Admitting: Orthopedic Surgery

## 2013-04-03 VITALS — BP 132/80 | Ht 64.0 in | Wt 200.0 lb

## 2013-04-03 DIAGNOSIS — M1710 Unilateral primary osteoarthritis, unspecified knee: Secondary | ICD-10-CM

## 2013-04-03 DIAGNOSIS — M25561 Pain in right knee: Secondary | ICD-10-CM

## 2013-04-03 DIAGNOSIS — M25569 Pain in unspecified knee: Secondary | ICD-10-CM

## 2013-04-03 MED ORDER — NABUMETONE 500 MG PO TABS: 500.0000 mg | ORAL_TABLET | Freq: Two times a day (BID) | ORAL | Status: DC

## 2013-04-03 NOTE — Patient Instructions (Addendum)
Wear and Tear Disorders of the Knee (Arthritis, Osteoarthritis)  Everyone will experience wear and tear injuries (arthritis, osteoarthritis) of the knee. These are the changes we all get as we age. They come from the joint stress of daily living. The amount of cartilage damage in your knee and your symptoms determine if you need surgery. Mild problems require approximately two months recovery time. More severe problems take several months to recover. With mild problems, your surgeon may find worn and rough cartilage surfaces. With severe changes, your surgeon may find cartilage that has completely worn away and exposed the bone. Loose bodies of bone and cartilage, bone spurs (excess bone growth), and injuries to the menisci (cushions between the large bones of your leg) are also common. All of these problems can cause pain.  For a mild wear and tear problem, rough cartilage may simply need to be shaved and smoothed. For more severe problems with areas of exposed bone, your surgeon may use an instrument for roughing up the bone surfaces to stimulate new cartilage growth. Loose bodies are usually removed. Torn menisci may be trimmed or repaired.  ABOUT THE ARTHROSCOPIC PROCEDURE  Arthroscopy is a surgical technique. It allows your orthopedic surgeon to diagnose and treat your knee injury with accuracy. The surgeon looks into your knee through a small scope. The scope is like a small (pencil-sized) telescope. Arthroscopy is less invasive than open knee surgery. You can expect a more rapid recovery. After the procedure, you will be moved to a recovery area until most of the effects of the medication have worn off. Your caregiver will discuss the test results with you.  RECOVERY  The severity of the arthritis and the type of procedure performed will determine recovery time. Other important factors include age, physical condition, medical conditions, and the type of rehabilitation program. Strengthening your muscles after  arthroscopy helps guarantee a better recovery. Follow your caregiver's instructions. Use crutches, rest, elevate, ice, and do knee exercises as instructed. Your caregivers will help you and instruct you with exercises and other physical therapy required to regain your mobility, muscle strength, and functioning following surgery. Only take over-the-counter or prescription medicines for pain, discomfort, or fever as directed by your caregiver.   SEEK MEDICAL CARE IF:   · There is increased bleeding (more than a small spot) from the wound.  · You notice redness, swelling, or increasing pain in the wound.  · Pus is coming from wound.  · You develop an unexplained oral temperature above 102° F (38.9° C) , or as your caregiver suggests.  · You notice a foul smell coming from the wound or dressing.  · You have severe pain with motion of the knee.  SEEK IMMEDIATE MEDICAL CARE IF:   · You develop a rash.  · You have difficulty breathing.  · You have any allergic problems.  MAKE SURE YOU:   · Understand these instructions.  · Will watch your condition.  · Will get help right away if you are not doing well or get worse.  Document Released: 10/28/2000 Document Revised: 01/23/2012 Document Reviewed: 03/26/2008  [employer]® Patient Information ©2014 [employer].

## 2013-04-03 NOTE — Telephone Encounter (Signed)
Patient called, request to speak with nurse, has question - Please call  [phone] [facility])

## 2013-04-03 NOTE — Telephone Encounter (Signed)
Returned call to patient, and answered questions regarding the type of shot she was given in the office.

## 2013-04-04 ENCOUNTER — Encounter: Payer: Self-pay | Admitting: Orthopedic Surgery

## 2013-04-04 NOTE — Progress Notes (Signed)
Patient ID: Traci Mora, female   DOB: 1941-01-14, 72 y.o.   MRN: [id number] Chief Complaint  Patient presents with  . Knee Pain    Right knee pain, no injury    Right knee pain x 2 months with history of pain x 3 years. The patient reports some catching and swelling in the knee and a feeling of tightness when she tries to flex the knee. She's noticed a decrease in range of motion. Pain is sharp sometimes dull comes and goes worse after exercise and she's noticed she can do this and stretches with the right leg that she can do with the left. Her review of systems is positive for itching of the skin heat and cold intolerance muscle aches and pains including cramping seasonal allergies  The other systems were reviewed and were normal  She has a history of a previous lumbar discectomy in 1988 a right breast lumpectomy and a tonsillectomy. She doesn't have any major medical problems other than pain and hypothyroidism and macular degeneration she has a family history of lung disease and cancer  She's married retired does not smoke.  BP 132/80  Ht 5\' 4"  (1.626 m)  Wt 200 lb (90.719 kg)  BMI 34.31 kg/m2  General appearance is normal, the patient is alert and oriented x3 with normal mood and affect. Body habitus is mesomorphic ambulation is normal without assistive device  Her left knee is nonswollen has full flexion. All ligaments are stable. Muscle tone is normal. Skin was intact good pulse normal sensation  Right knee decreased flexion only about 115 however it is stable had a negative McMurray's motor exam is normal skin was intact tenderness along the medial joint line who is patellofemoral crepitance  X-rays show medial compartment joint space narrowing consistent with osteoarthritis  Impression osteoarthritis  Plan injection, oral supplements. Relafen as needed for arthritis which did relieve her pain when she took it for something else  Knee  Injection Procedure  Note  Pre-operative Diagnosis: right knee oa  Post-operative Diagnosis: same  Indications: pain  Anesthesia: ethyl chloride   Procedure Details   Verbal consent was obtained for the procedure. Time out was completed.The joint was prepped with alcohol, followed by  Ethyl chloride spray and A 20 gauge needle was inserted into the knee via lateral approach; 4ml 1% lidocaine and 1 ml of depomedrol  was then injected into the joint . The needle was removed and the area cleansed and dressed.  Complications:  None; patient tolerated the procedure well.

## 2013-10-09 ENCOUNTER — Ambulatory Visit (HOSPITAL_COMMUNITY): Payer: Medicare Other

## 2013-10-09 ENCOUNTER — Encounter (HOSPITAL_COMMUNITY): Payer: Self-pay

## 2013-10-16 ENCOUNTER — Telehealth: Payer: Self-pay | Admitting: Nurse Practitioner

## 2013-10-16 NOTE — Telephone Encounter (Signed)
Patient called to schedule aex and to schedule appt for possible uti. Was a patient of Romine has not seen anyone else. Got her schedule in a cancellation spot for Friday for aex and for uti.  Didn't want to come in to times in the next few days since she is coming from [place]. She is scheduled for 10/18/13 at 9:45 am. She said she can only come between 10-12.

## 2013-10-16 NOTE — Telephone Encounter (Signed)
Spoke with patient. She feels she is having vaginal dc and feels burning when she urinates. She does not feel that it is a UTI but she is drinking cranberry juice and pushing fluids. Denies fevers or back pain. I offered OV earlier, today or Thursday and patient declines. She would like to wait until Friday. Patient has AEX scheduled for Friday.

## 2013-10-18 ENCOUNTER — Ambulatory Visit (INDEPENDENT_AMBULATORY_CARE_PROVIDER_SITE_OTHER): Payer: Medicare Other | Admitting: Gynecology

## 2013-10-18 ENCOUNTER — Ambulatory Visit: Payer: Self-pay | Admitting: Nurse Practitioner

## 2013-10-18 ENCOUNTER — Encounter: Payer: Self-pay | Admitting: Gynecology

## 2013-10-18 VITALS — BP 128/78 | HR 80 | Resp 16 | Ht 64.5 in | Wt 206.0 lb

## 2013-10-18 DIAGNOSIS — N898 Other specified noninflammatory disorders of vagina: Secondary | ICD-10-CM

## 2013-10-18 DIAGNOSIS — Z01419 Encounter for gynecological examination (general) (routine) without abnormal findings: Secondary | ICD-10-CM

## 2013-10-18 DIAGNOSIS — Z0000 Encounter for general adult medical examination without abnormal findings: Secondary | ICD-10-CM

## 2013-10-18 DIAGNOSIS — R350 Frequency of micturition: Secondary | ICD-10-CM

## 2013-10-18 DIAGNOSIS — N952 Postmenopausal atrophic vaginitis: Secondary | ICD-10-CM

## 2013-10-18 LAB — POCT URINALYSIS DIPSTICK
Urobilinogen, UA: NEGATIVE
pH, UA: 5

## 2013-10-18 NOTE — Progress Notes (Signed)
72 y.o. Married Caucasian female   G2P2002 here for annual exam. Pt reports questionable yeast infection, describes itching, burning and vaginal discharge.  Discharge yellow to clear, no odor.  Itching for 22month. A little of urinary incontinence but no pad use.  Doesn't change underwear. She does report hot flashes, does not have night sweats, does have vaginal dryness.  She is not using lubricants.  She does not report post-menopasual bleeding  No LMP recorded. Patient is postmenopausal.          Sexually active: no  The current method of family planning is post menopausal status.    Exercising: yes  The patient does not participate in regular exercise at present. due to knee not for 6 months Last pap: 02/17/2010 Negative  Abnormal PAP: no Mammogram: 09/07/09; 2012 [facility]) BSE: no Colonoscopy: 1/12 f/u in 10 years  DEXA:  2002 Alcohol: no Tobacco: no  Hgb: PCP ; PCP  Health Maintenance  Topic Date Due  . Tetanus/tdap  06/02/1958  . Colonoscopy  06/02/1989  . Zostavax  06/03/1999  . Mammogram  08/31/2003  . Pneumococcal Polysaccharide Vaccine Age 42 And Over  06/02/2004  . Influenza Vaccine  06/14/2013    Family History  Problem Relation Age of Onset  . Cancer Mother   . Cancer Father     Patient Active Problem List   Diagnosis Date Noted  . OA (osteoarthritis) of knee 04/03/2013  . Postural imbalance 06/25/2012  . Breast cancer   . ARTHRITIS, LEFT HIP 10/21/2010  . HIP PAIN 10/21/2010  . BURSITIS, LEFT HIP 10/21/2010    Past Medical History  Diagnosis Date  . Gastric ulcer     history of  . Thyroid disease   . Breast cancer   . Cellulitis of leg     history of  . Left hip pain     history  . Allergy     Past Surgical History  Procedure Laterality Date  . Tonsillectomy    . Back surgery    . Mastectomy partial / lumpectomy w/ axillary lymphadenectomy    . Colonoscopy      Allergies: Review of patient's allergies indicates no known  allergies.  Current Outpatient Prescriptions  Medication Sig Dispense Refill  . Calcium Carbonate-Vitamin D (CALCIUM + D) 600-200 MG-UNIT TABS Take by mouth daily.        . Cholecalciferol (VITAMIN D PO) Take by mouth.      . clobetasol cream (TEMOVATE) 0.05 % Apply 1 application topically 2 (two) times daily.       Marland Kitchen glucosamine-chondroitin 500-400 MG tablet Take 1 tablet by mouth 3 (three) times daily.      Marland Kitchen levothyroxine (SYNTHROID, LEVOTHROID) 88 MCG tablet Take 88 mcg by mouth daily.        . Loratadine (CLARITIN) 10 MG CAPS Take 10 mg by mouth daily.      . Omega-3 Fatty Acids (FISH OIL PO) Take by mouth.      Marland Kitchen omeprazole (PRILOSEC) 20 MG capsule Take 20 mg by mouth daily.       . cetirizine (ZYRTEC) 10 MG tablet Take 10 mg by mouth daily.        . nabumetone (RELAFEN) 500 MG tablet Take 1 tablet (500 mg total) by mouth 2 (two) times daily.  60 tablet  5   No current facility-administered medications for this visit.    ROS: Pertinent items are noted in HPI.  Exam:    BP 128/78  Pulse 80  Resp 16  Ht 5' 4.5" (1.638 m)  Wt 206 lb (93.441 kg)  BMI 34.83 kg/m2 Weight change: @WEIGHTCHANGE @ Last 3 height recordings:  Ht Readings from Last 3 Encounters:  10/18/13 5' 4.5" (1.638 m)  04/03/13 5\' 4"  (1.626 m)  10/10/11 5\' 4"  (1.626 m)   General appearance: alert, cooperative and appears stated age Head: Normocephalic, without obvious abnormality, atraumatic Neck: no adenopathy, no carotid bruit, no JVD, supple, symmetrical, trachea midline and thyroid not enlarged, symmetric, no tenderness/mass/nodules Lungs: clear to auscultation bilaterally Breasts: normal appearance, no masses or tendernessof left breat, right with scar and radiation changes Heart: regular rate and rhythm, S1, S2 normal, no murmur, click, rub or gallop Abdomen: soft, non-tender; bowel sounds normal; no masses,  no organomegaly Extremities: extremities normal, atraumatic, no cyanosis or edema Skin: Skin  color, texture, turgor normal. No rashes or lesions Lymph nodes: Cervical, supraclavicular, and axillary nodes normal. no inguinal nodes palpated Neurologic: Grossly normal   Pelvic: External genitalia:  no lesions              Urethra: normal appearing urethra with no masses, tenderness or lesions              Bartholins and Skenes: normal                 Vagina: atrophic, vaginal discharge - clear, frothy and scant, WET MOUNT done - results: negative for pathogens, normal epithelial cells, KOH done, clue cells, vaginal pH is 5.5              Cervix: normal appearance              Pap taken: no        Bimanual Exam:  Uterus:  uterus is normal size, shape, consistency and nontender                                      Adnexa:    no masses                                      Rectovaginal: Confirms                                      Anus:  normal sphincter tone, no lesions  A: well woman Vaginal discharge History breast cancer-s/p RT and lumpectomy Urinary frequency     P: mammogram pap smear  Not done Pt offered vaginal estrogen but prefers to try A&D to vulva May consider poise pads Urine culture Wet prep no infection, discussed atrophic nature of vagina, offered but pt declines vaginal estrogen at this time counseled on breast self exam, mammography screening, adequate intake of calcium and vitamin D, diet and exercise return annually or prn Discussed PAP guideline changes, importance of weight bearing exercises, calcium, vit D and balanced diet.  An After Visit Summary was printed and given to the patient.

## 2013-10-18 NOTE — Patient Instructions (Addendum)

## 2013-10-19 LAB — URINE CULTURE

## 2013-11-21 ENCOUNTER — Ambulatory Visit (HOSPITAL_COMMUNITY): Payer: Medicare Other

## 2013-11-21 ENCOUNTER — Other Ambulatory Visit (HOSPITAL_COMMUNITY): Payer: Medicare Other

## 2013-11-22 NOTE — Progress Notes (Signed)
This encounter was created in error - please disregard.

## 2013-12-05 ENCOUNTER — Encounter (HOSPITAL_COMMUNITY): Payer: Self-pay

## 2013-12-05 ENCOUNTER — Encounter (HOSPITAL_BASED_OUTPATIENT_CLINIC_OR_DEPARTMENT_OTHER): Payer: Medicare Other

## 2013-12-05 ENCOUNTER — Encounter (HOSPITAL_COMMUNITY): Payer: Medicare Other | Attending: Hematology and Oncology

## 2013-12-05 VITALS — BP 133/85 | HR 68 | Temp 98.5°F | Resp 16 | Wt 200.2 lb

## 2013-12-05 DIAGNOSIS — Z853 Personal history of malignant neoplasm of breast: Secondary | ICD-10-CM | POA: Insufficient documentation

## 2013-12-05 DIAGNOSIS — E039 Hypothyroidism, unspecified: Secondary | ICD-10-CM | POA: Diagnosis not present

## 2013-12-05 DIAGNOSIS — K219 Gastro-esophageal reflux disease without esophagitis: Secondary | ICD-10-CM | POA: Insufficient documentation

## 2013-12-05 DIAGNOSIS — Z09 Encounter for follow-up examination after completed treatment for conditions other than malignant neoplasm: Secondary | ICD-10-CM | POA: Diagnosis present

## 2013-12-05 DIAGNOSIS — C50919 Malignant neoplasm of unspecified site of unspecified female breast: Secondary | ICD-10-CM

## 2013-12-05 LAB — CANCER ANTIGEN 27.29: CA 27.29: 27 U/mL (ref 0–39)

## 2013-12-05 LAB — CBC
HEMATOCRIT: 41.5 % (ref 36.0–46.0)
Hemoglobin: 13.7 g/dL (ref 12.0–15.0)
MCH: 28.8 pg (ref 26.0–34.0)
MCHC: 33 g/dL (ref 30.0–36.0)
MCV: 87.2 fL (ref 78.0–100.0)
PLATELETS: 190 10*3/uL (ref 150–400)
RBC: 4.76 MIL/uL (ref 3.87–5.11)
RDW: 12.8 % (ref 11.5–15.5)
WBC: 5.5 10*3/uL (ref 4.0–10.5)

## 2013-12-05 LAB — COMPREHENSIVE METABOLIC PANEL
ALBUMIN: 4 g/dL (ref 3.5–5.2)
ALK PHOS: 79 U/L (ref 39–117)
ALT: 20 U/L (ref 0–35)
AST: 18 U/L (ref 0–37)
BUN: 13 mg/dL (ref 6–23)
CALCIUM: 9.7 mg/dL (ref 8.4–10.5)
CO2: 29 mEq/L (ref 19–32)
Chloride: 101 mEq/L (ref 96–112)
Creatinine, Ser: 0.8 mg/dL (ref 0.50–1.10)
GFR calc non Af Amer: 71 mL/min — ABNORMAL LOW (ref >90)
GFR, EST AFRICAN AMERICAN: 82 mL/min — AB (ref >90)
GLUCOSE: 93 mg/dL (ref 70–99)
POTASSIUM: 4.1 meq/L (ref 3.7–5.3)
SODIUM: 140 meq/L (ref 137–147)
TOTAL PROTEIN: 7.5 g/dL (ref 6.0–8.3)
Total Bilirubin: 0.6 mg/dL (ref 0.3–1.2)

## 2013-12-05 LAB — CEA: CEA: <0.5 ng/mL (ref 0.0–5.0)

## 2013-12-05 NOTE — Progress Notes (Signed)
     [facility]  OFFICE PROGRESS NOTE  FAGAN,ROY, MD [address] [place] [address]  DIAGNOSIS: Breast cancer  Chief Complaint  Patient presents with  . Breast Cancer    CURRENT THERAPY: Watchful expectation per patient preference  INTERVAL HISTORY: Traci Mora 72 y.o. female returns for followup of stage I right breast cancer, ER/PR positive, HER-2/neu negative, treated with lumpectomy, sentinel node biopsy, followed a radiotherapy but declining further treatment. She continues to do well taking care of her 3 grandchildren because they were abandoned by their mother. Appetite is good with no nausea, vomiting, diarrhea, constipation, dysuria, hematuria, incontinence, lower extremity swelling or redness, chest pain, PND, orthopnea, palpitations, or lymphedema. Because her breasts are lumpy, she does not do self breast examination.   MEDICAL HISTORY: Past Medical History  Diagnosis Date  . Gastric ulcer     history of  . Thyroid disease   . Breast cancer 2002    right  . Cellulitis of leg     history of  . Left hip pain     history  . Allergy     INTERIM HISTORY: has ARTHRITIS, LEFT HIP; HIP PAIN; BURSITIS, LEFT HIP; Breast cancer; Postural imbalance; and OA (osteoarthritis) of knee on her problem list.   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 Ki-67 marker was low of 2%. 2 sentinel nodes were negative and she was treated with lumpectomy, sentinel node biopsy, followed by radiation therapy and then she declined any further therapy after long discussions. She thus far remains free of disease with her surgery 09/02/2001  ALLERGIES:  is allergic to codeine.  MEDICATIONS: has a current medication list which includes the following prescription(s): apple cider vinegar, cholecalciferol, clobetasol  cream, glucosamine-chondroitin, levothyroxine, loratadine, multiple vitamins-minerals, and omega-3 fatty acids.  SURGICAL HISTORY:  Past Surgical History  Procedure Laterality Date  . Tonsillectomy    . Back surgery    . Mastectomy partial / lumpectomy w/ axillary lymphadenectomy Right 2002  . Colonoscopy      FAMILY HISTORY: family history includes Cancer in her father and mother.  SOCIAL HISTORY:  reports that she has never smoked. She has never used smokeless tobacco. She reports that she drinks alcohol. She reports that she does not use illicit drugs.  REVIEW OF SYSTEMS:  Other than that discussed above is noncontributory.  PHYSICAL EXAMINATION: ECOG PERFORMANCE STATUS: 0 - Asymptomatic  Blood pressure 133/85, pulse 68, temperature 98.5 F (36.9 C), temperature source Oral, resp. rate 16, weight 200 lb 3.2 oz (90.81 kg).  GENERAL:alert, no distress and comfortable SKIN: skin color, texture, turgor are normal, no rashes or significant lesions EYES: PERLA; Conjunctiva are pink and non-injected, sclera clear OROPHARYNX:no exudate, no erythema on lips, buccal mucosa, or tongue. NECK: supple, thyroid normal size, non-tender, without nodularity. No masses CHEST: Status post right breast lumpectomy with no masses in either breast. LYMPH:  no palpable lymphadenopathy in the cervical, axillary or inguinal LUNGS: clear to auscultation and percussion with normal breathing effort HEART: regular rate & rhythm and no murmurs. ABDOMEN:abdomen soft, non-tender and normal bowel sounds MUSCULOSKELETAL:no cyanosis of digits and no clubbing. Range of motion normal. No evidence of lymphedema. NEURO: alert & oriented x 3 with fluent speech, no focal motor/sensory deficits   LABORATORY DATA: Office Visit on 12/05/2013  Component Date Value Range Status  .   WBC 12/05/2013 5.5  4.0 - 10.5 K/uL Final  . RBC 12/05/2013 4.76  3.87 - 5.11 MIL/uL Final  . Hemoglobin 12/05/2013 13.7  12.0 - 15.0 g/dL  Final  . HCT 12/05/2013 41.5  36.0 - 46.0 % Final  . MCV 12/05/2013 87.2  78.0 - 100.0 fL Final  . MCH 12/05/2013 28.8  26.0 - 34.0 pg Final  . MCHC 12/05/2013 33.0  30.0 - 36.0 g/dL Final  . RDW 12/05/2013 12.8  11.5 - 15.5 % Final  . Platelets 12/05/2013 190  150 - 400 K/uL Final  . Sodium 12/05/2013 140  137 - 147 mEq/L Final  . Potassium 12/05/2013 4.1  3.7 - 5.3 mEq/L Final  . Chloride 12/05/2013 101  96 - 112 mEq/L Final  . CO2 12/05/2013 29  19 - 32 mEq/L Final  . Glucose, Bld 12/05/2013 93  70 - 99 mg/dL Final  . BUN 12/05/2013 13  6 - 23 mg/dL Final  . Creatinine, Ser 12/05/2013 0.80  0.50 - 1.10 mg/dL Final  . Calcium 12/05/2013 9.7  8.4 - 10.5 mg/dL Final  . Total Protein 12/05/2013 7.5  6.0 - 8.3 g/dL Final  . Albumin 12/05/2013 4.0  3.5 - 5.2 g/dL Final  . AST 12/05/2013 18  0 - 37 U/L Final  . ALT 12/05/2013 20  0 - 35 U/L Final  . Alkaline Phosphatase 12/05/2013 79  39 - 117 U/L Final  . Total Bilirubin 12/05/2013 0.6  0.3 - 1.2 mg/dL Final  . GFR calc non Af Amer 12/05/2013 71* >90 mL/min Final  . GFR calc Af Amer 12/05/2013 82* >90 mL/min Final   Comment: (NOTE)                          The eGFR has been calculated using the CKD EPI equation.                          This calculation has not been validated in all clinical situations.                          eGFR's persistently <90 mL/min signify possible Chronic Kidney                          Disease.    PATHOLOGY: No new pathology.  Urinalysis    Component Value Date/Time   BILIRUBINUR - 10/18/2013 1054   UROBILINOGEN negative 10/18/2013 1054   NITRITE - 10/18/2013 1054   LEUKOCYTESUR small (1+) 10/18/2013 1054    RADIOGRAPHIC STUDIES: Declines further mammographic examinations.  ASSESSMENT:  #1.stage I (T1 C., N0, M0 does parents infiltrating lobular carcinoma the right breast, estrogen receptor +80%, progesterone receptor +88%, HER-2/neu not amplified. Ki-67 marker was low of 2%. two sentinel nodes were  negative and she was treated with lumpectomy, sentinel node biopsy, followed by radiation therapy and then she declined any further therapy after long discussions. #2. Hypothyroidism, on treatment. #3. Allergic rhinitis, asymptomatic at this time. #4. Gastroesophageal reflux disease, on treatment.   PLAN:  #1. If lab tests are normal, no further appointments will be made in this office since the patient is not on active treatment and declines further mammographic intervention. She'll be closely followed by her family physician. #2. She was encouraged to call should any new symptoms occur that are troublesome and persistent or family physician recognizes  an abnormality that would require oncologic followup.   All questions were answered. The patient knows to call the clinic with any problems, questions or concerns. We can certainly see the patient much sooner if necessary.   I spent 25 minutes counseling the patient face to face. The total time spent in the appointment was 30 minutes.    Formanek, Gregory A, MD 12/05/2013 12:20 PM   

## 2013-12-05 NOTE — Progress Notes (Signed)
Labs drawn today for cbc/diff,cmp,cea,[id number] 

## 2013-12-05 NOTE — Patient Instructions (Signed)
[facility] Discharge Instructions  RECOMMENDATIONS MADE BY THE CONSULTANT AND ANY TEST RESULTS WILL BE SENT TO YOUR REFERRING PHYSICIAN.  EXAM FINDINGS BY THE PHYSICIAN TODAY AND SIGNS OR SYMPTOMS TO REPORT TO CLINIC OR PRIMARY PHYSICIAN: Exam and findings as discussed by Dr. Barnet Glasgow.  INSTRUCTIONS/FOLLOW-UP: 1.  Assuming the lab work done today is normal, we are releasing you from the clinic today.  Please call our office Monday if you want to know what your lab work shows.  You can also have access to your labs through the patient access portal called MyChart - information on MyChart is in this packet of information.  2.  We are here should you need [place] at any point in the future!  Thank you for choosing [facility] to provide your oncology and hematology care.  To afford each patient quality time with our providers, please arrive at least 15 minutes before your scheduled appointment time.  With your help, our goal is to use those 15 minutes to complete the necessary work-up to ensure our physicians have the information they need to help with your evaluation and healthcare recommendations.    Effective January 1st, 2014, we ask that you re-schedule your appointment with our physicians should you arrive 10 or more minutes late for your appointment.  We strive to give you quality time with our providers, and arriving late affects you and other patients whose appointments are after yours.    Again, thank you for choosing [facility].  Our hope is that these requests will decrease the amount of time that you wait before being seen by our physicians.       _____________________________________________________________  Should you have questions after your visit to [facility], please contact our office at [phone] between the hours of 8:30 a.m. and 5:00 p.m.  Voicemails left after 4:30 p.m. will not be returned until the following  business day.  For prescription refill requests, have your pharmacy contact our office with your prescription refill request.

## 2014-07-14 ENCOUNTER — Telehealth: Payer: Self-pay | Admitting: Orthopedic Surgery

## 2014-07-14 NOTE — Telephone Encounter (Signed)
You saw Traci Mora 04/03/13 for right knee pain.  She now wants to get therapy to strengthen her knee.  She is in [place] during the week and asked if you will give her a therapy order and fax to [facility] in [place].  The therapist is Dixie Dials. Phone [phone] # [phone] [name]'s phone # is [phone]

## 2014-07-16 NOTE — Telephone Encounter (Signed)
Send therapy order as indicated

## 2014-07-17 ENCOUNTER — Other Ambulatory Visit: Payer: Self-pay | Admitting: *Deleted

## 2014-07-17 DIAGNOSIS — M1711 Unilateral primary osteoarthritis, right knee: Secondary | ICD-10-CM

## 2014-07-17 NOTE — Telephone Encounter (Signed)
Order sent as requested, patient aware

## 2014-09-15 ENCOUNTER — Encounter (HOSPITAL_COMMUNITY): Payer: Self-pay

## 2014-10-20 ENCOUNTER — Ambulatory Visit: Payer: Medicare Other | Admitting: Gynecology

## 2014-11-13 ENCOUNTER — Encounter: Payer: Self-pay | Admitting: Certified Nurse Midwife

## 2014-11-13 ENCOUNTER — Ambulatory Visit (INDEPENDENT_AMBULATORY_CARE_PROVIDER_SITE_OTHER): Payer: Medicare Other | Admitting: Certified Nurse Midwife

## 2014-11-13 VITALS — BP 114/70 | HR 68 | Resp 16 | Ht 64.25 in | Wt 182.0 lb

## 2014-11-13 DIAGNOSIS — Z124 Encounter for screening for malignant neoplasm of cervix: Secondary | ICD-10-CM

## 2014-11-13 DIAGNOSIS — Z01419 Encounter for gynecological examination (general) (routine) without abnormal findings: Secondary | ICD-10-CM

## 2014-11-13 DIAGNOSIS — Z853 Personal history of malignant neoplasm of breast: Secondary | ICD-10-CM

## 2014-11-13 DIAGNOSIS — C50911 Malignant neoplasm of unspecified site of right female breast: Secondary | ICD-10-CM

## 2014-11-13 NOTE — Patient Instructions (Signed)

## 2014-11-13 NOTE — Progress Notes (Signed)
73 y.o. [id number] Married Caucasian Fe here for annual exam. Menopausal no HRT. Denies vaginal bleeding or vaginal dryness. Breast cancer survivor, has been released from oncology. Sees PCP for aex, Hypothyroid management and labs. Busy with keeping grandchildren at present. Has not had mammogram because she has not had time. Has not had BMD also and does not plan to at this point.No history of abnormal pap smears. No other health issues today.   Patient's last menstrual period was 11/14/1994.          Sexually active: Yes.    The current method of family planning is post menopausal status.    Exercising: No.  exercise but some physical therapy Smoker:  no  Health Maintenance: Pap: 02-17-10 neg MMG:  2012 [facility] overdue Colonoscopy:  2012 f/u 10 yrs negative BMD:   2002 TDaP:UTD Labs: none Self breast exam: not done    reports that she has never smoked. She has never used smokeless tobacco. She reports that she does not drink alcohol or use illicit drugs.  Past Medical History  Diagnosis Date  . Gastric ulcer     history of  . Thyroid disease   . Breast cancer 2002    right  . Cellulitis of leg     history of  . Left hip pain     history  . Allergy     Past Surgical History  Procedure Laterality Date  . Tonsillectomy    . Back surgery    . Mastectomy partial / lumpectomy w/ axillary lymphadenectomy Right 2002  . Colonoscopy      Current Outpatient Prescriptions  Medication Sig Dispense Refill  . Cholecalciferol (VITAMIN D PO) Take 2,000 mg by mouth daily.     . clobetasol cream (TEMOVATE) 3.24 % Apply 1 application topically 2 (two) times daily.     Marland Kitchen glucosamine-chondroitin 500-400 MG tablet Take 2 tablets by mouth daily.     Marland Kitchen levothyroxine (SYNTHROID, LEVOTHROID) 88 MCG tablet Take 88 mcg by mouth daily.      . Loratadine (CLARITIN) 10 MG CAPS Take 10 mg by mouth daily.    . Multiple Vitamins-Minerals (PRESERVISION AREDS 2 PO) Take 2 tablets by mouth 2 (two) times  daily.    . Omega-3 Fatty Acids (FISH OIL PO) Take by mouth.     No current facility-administered medications for this visit.    Family History  Problem Relation Age of Onset  . Cancer Mother   . Cancer Father     ROS:  Pertinent items are noted in HPI.  Otherwise, a comprehensive ROS was negative.  Exam:   BP 114/70 mmHg  Pulse 68  Resp 16  Ht 5' 4.25" (1.632 m)  Wt 182 lb (82.555 kg)  BMI 31.00 kg/m2  LMP 11/14/1994 Height: 5' 4.25" (163.2 cm)  Ht Readings from Last 3 Encounters:  11/13/14 5' 4.25" (1.632 m)  10/18/13 5' 4.5" (1.638 m)  04/03/13 5\' 4"  (1.626 m)    General appearance: alert, cooperative and appears stated age Head: Normocephalic, without obvious abnormality, atraumatic Neck: no adenopathy, supple, symmetrical, trachea midline and thyroid normal to inspection and palpation Lungs: clear to auscultation bilaterally Breasts: normal, no masses, nipple discharge skin change, enlarged axillary lymph nodes or tenderness  noted . Right breast partial mastectomy scarring Heart: regular rate and rhythm Abdomen: soft, non-tender; no masses,  no organomegaly Extremities: extremities normal, atraumatic, no cyanosis or edema Skin: Skin color, texture, turgor normal. No rashes or lesions Lymph nodes: Cervical, supraclavicular,  and axillary nodes normal. No abnormal inguinal nodes palpated Neurologic: Grossly normal   Pelvic: External genitalia:  no lesions              Urethra:  normal appearing urethra with no masses, tenderness or lesions              Bartholin's and Skene's: normal                 Vagina: normal appearing vagina with normal color and discharge, no lesions              Cervix: normal, non tender              Pap taken: Yes.   Bimanual Exam:  Uterus:  normal size, contour, position, consistency, mobility, non-tender              Adnexa: normal adnexa and no mass, fullness, tenderness               Rectovaginal: Confirms               Anus:   normal sphincter tone, no lesions  A:  Well Woman with normal exam  Menopausal,no HRT  History of breast cancer right with mastectomy Mammogram due  Hypothyroid with PCP management  P:   Reviewed health and wellness pertinent to exam  Aware of need to evaluate if vaginal bleeding  Discussed importance of annual mammogram and SBE  Patient needs help in scheduling in [place] at present. Will call with information  Continue follow up with PCP as indicated  Pap smear taken today    counseled on breast self exam, mammography screening, adequate intake of calcium and vitamin D, diet and exercise, Kegel's exercises  return annually or prn  An After Visit Summary was printed and given to the patient.

## 2014-11-17 ENCOUNTER — Telehealth: Payer: Self-pay

## 2014-11-17 NOTE — Telephone Encounter (Signed)
Left message to call [facility] at [phone].  Advised patient [facility] does 3D mammograms. They are located at [address] [place], [place] [address]. Their phone number is [phone].

## 2014-11-17 NOTE — Telephone Encounter (Signed)
-----   Message from Regina Eck, CNM sent at 11/13/2014  2:14 PM EST ----- Please locate a facility in [facility] who does 3 D mammogram for this patient and let her know

## 2014-11-18 LAB — IPS PAP SMEAR ONLY

## 2014-11-18 NOTE — Progress Notes (Signed)
Reviewed personally.  M. Suzanne Oisin Yoakum, MD.  

## 2014-11-20 NOTE — Telephone Encounter (Signed)
Spoke with patient advised of 3D mammogram location at [facility]. Provided address and phone number seen below. Patient is agreeable and will call to schedule.  Routing to provider for final review. Patient agreeable to disposition. Will close encounter

## 2015-01-09 ENCOUNTER — Telehealth: Payer: Self-pay | Admitting: Certified Nurse Midwife

## 2015-01-09 NOTE — Telephone Encounter (Signed)
Pt has mammogram scheduled in [place] in near future. They are requesting records of last mammogram. Pt not sure where she had last mammogram. Looked at patients electronic record and paper chart and cannot find a mammogram more recent than 2010. Pt insists she's had one since then. Pt asks for help determining last location of test and how to get records.

## 2015-01-12 NOTE — Telephone Encounter (Signed)
Call to [facility], they do not have anything for mammograms other than what is currently in EPIC.   [facility], they have last mammogram as 09/07/09.  Advised patient. She is agreeable. She will go to [facility] and sign for records release.   Routing to provider for final review. Patient agreeable to disposition. Will close encounter

## 2015-01-13 ENCOUNTER — Telehealth: Payer: Self-pay | Admitting: Certified Nurse Midwife

## 2015-01-13 NOTE — Telephone Encounter (Signed)
Pt need an order for a 3d diagnostic mammogram sent to [facility] radiology in [place]. Their number is [phone].

## 2015-01-13 NOTE — Telephone Encounter (Signed)
Spoke with patient. She is scheduled for screening 3D Mammogram at [facility] in [place] for 01/21/15. Patient requested 3D mammogram.  Patient states that [facility] states she may need diagnostic. Advised patient, no, she will not need diagnostic imaging at this time as last mammogram with [facility] on 09/07/09 recommended screening mammogram only in one year.  Patient denies any current problem with her breasts. Patient is advised again to obtain her records from [facility] so that they may be sent to [facility]. She is agreeable. She will go to [facility] to sign for release of records. Routing to provider for final review. Patient agreeable to disposition. Will close encounter

## 2015-01-13 NOTE — Telephone Encounter (Signed)
Called and confirmed with Baker Janus at [facility] that an order is not needed for a screening [place] at [facility].   Detailed message left to advise. Okay per designated party release form.   Advised to return call with any questions.  Routing to provider for final review. Patient agreeable to disposition. Will close encounter

## 2015-01-13 NOTE — Telephone Encounter (Signed)
Left message to call K[facility] at [phone]. 

## 2015-01-13 NOTE — Telephone Encounter (Signed)
Pts emergency contact left voicemail requesting order for a 3D mammogram sent to [facility] in [facility]
